# Patient Record
Sex: Female | Born: 1935
Health system: Southern US, Community
[De-identification: ages and names within clinical notes are randomized; demographics above are authoritative.]

## PROBLEM LIST (undated history)

## (undated) DIAGNOSIS — M8580 Other specified disorders of bone density and structure, unspecified site: Secondary | ICD-10-CM

## (undated) DIAGNOSIS — S62109A Fracture of unspecified carpal bone, unspecified wrist, initial encounter for closed fracture: Secondary | ICD-10-CM

## (undated) DIAGNOSIS — R3915 Urgency of urination: Secondary | ICD-10-CM

## (undated) DIAGNOSIS — IMO0002 Reserved for concepts with insufficient information to code with codable children: Secondary | ICD-10-CM

## (undated) DIAGNOSIS — N814 Uterovaginal prolapse, unspecified: Secondary | ICD-10-CM

## (undated) DIAGNOSIS — K5790 Diverticulosis of intestine, part unspecified, without perforation or abscess without bleeding: Secondary | ICD-10-CM

## (undated) DIAGNOSIS — E785 Hyperlipidemia, unspecified: Secondary | ICD-10-CM

## (undated) DIAGNOSIS — M40209 Unspecified kyphosis, site unspecified: Secondary | ICD-10-CM

## (undated) DIAGNOSIS — C73 Malignant neoplasm of thyroid gland: Secondary | ICD-10-CM

## (undated) DIAGNOSIS — I10 Essential (primary) hypertension: Secondary | ICD-10-CM

## (undated) DIAGNOSIS — M519 Unspecified thoracic, thoracolumbar and lumbosacral intervertebral disc disorder: Secondary | ICD-10-CM

## (undated) HISTORY — PX: THYROIDECTOMY, PARTIAL: SHX18

## (undated) HISTORY — DX: Uterovaginal prolapse, unspecified: N81.4

## (undated) HISTORY — DX: Fracture of unspecified carpal bone, unspecified wrist, initial encounter for closed fracture: S62.109A

## (undated) HISTORY — DX: Malignant neoplasm of thyroid gland: C73

## (undated) HISTORY — PX: THYROIDECTOMY: SHX17

## (undated) HISTORY — PX: CERVICAL POLYPECTOMY: SHX88

## (undated) HISTORY — DX: Essential (primary) hypertension: I10

## (undated) HISTORY — DX: Unspecified thoracic, thoracolumbar and lumbosacral intervertebral disc disorder: M51.9

## (undated) HISTORY — DX: Other specified disorders of bone density and structure, unspecified site: M85.80

## (undated) HISTORY — DX: Diverticulosis of intestine, part unspecified, without perforation or abscess without bleeding: K57.90

## (undated) HISTORY — DX: Unspecified kyphosis, site unspecified: M40.209

## (undated) HISTORY — DX: Reserved for concepts with insufficient information to code with codable children: IMO0002

## (undated) HISTORY — DX: Urgency of urination: R39.15

## (undated) HISTORY — DX: Hyperlipidemia, unspecified: E78.5

## (undated) HISTORY — PX: OTHER SURGICAL HISTORY: SHX169

---

## 1968-02-21 DIAGNOSIS — C73 Malignant neoplasm of thyroid gland: Secondary | ICD-10-CM

## 1968-02-21 HISTORY — DX: Malignant neoplasm of thyroid gland: C73

## 1992-09-20 HISTORY — PX: OTHER SURGICAL HISTORY: SHX169

## 1994-09-21 HISTORY — PX: OTHER SURGICAL HISTORY: SHX169

## 1997-01-21 HISTORY — PX: OTHER SURGICAL HISTORY: SHX169

## 1997-04-29 ENCOUNTER — Ambulatory Visit (HOSPITAL_COMMUNITY): Admission: RE | Admit: 1997-04-29 | Discharge: 1997-04-29 | Payer: Self-pay | Admitting: *Deleted

## 1997-07-31 ENCOUNTER — Encounter: Admission: RE | Admit: 1997-07-31 | Discharge: 1997-10-29 | Payer: Self-pay | Admitting: *Deleted

## 1998-05-04 ENCOUNTER — Encounter: Payer: Self-pay | Admitting: *Deleted

## 1998-05-04 ENCOUNTER — Ambulatory Visit (HOSPITAL_COMMUNITY): Admission: RE | Admit: 1998-05-04 | Discharge: 1998-05-04 | Payer: Self-pay | Admitting: *Deleted

## 1998-06-30 ENCOUNTER — Other Ambulatory Visit: Admission: RE | Admit: 1998-06-30 | Discharge: 1998-06-30 | Payer: Self-pay | Admitting: Family Medicine

## 1998-07-06 ENCOUNTER — Ambulatory Visit (HOSPITAL_COMMUNITY): Admission: RE | Admit: 1998-07-06 | Discharge: 1998-07-06 | Payer: Self-pay | Admitting: *Deleted

## 1998-07-06 ENCOUNTER — Encounter: Payer: Self-pay | Admitting: Family Medicine

## 1999-05-10 ENCOUNTER — Encounter: Payer: Self-pay | Admitting: *Deleted

## 1999-05-10 ENCOUNTER — Ambulatory Visit (HOSPITAL_COMMUNITY): Admission: RE | Admit: 1999-05-10 | Discharge: 1999-05-10 | Payer: Self-pay | Admitting: *Deleted

## 1999-10-14 ENCOUNTER — Other Ambulatory Visit: Admission: RE | Admit: 1999-10-14 | Discharge: 1999-10-14 | Payer: Self-pay | Admitting: Family Medicine

## 2000-05-16 ENCOUNTER — Ambulatory Visit (HOSPITAL_COMMUNITY): Admission: RE | Admit: 2000-05-16 | Discharge: 2000-05-16 | Payer: Self-pay | Admitting: *Deleted

## 2000-05-16 ENCOUNTER — Encounter: Payer: Self-pay | Admitting: *Deleted

## 2000-11-08 ENCOUNTER — Other Ambulatory Visit: Admission: RE | Admit: 2000-11-08 | Discharge: 2000-11-08 | Payer: Self-pay | Admitting: Family Medicine

## 2001-05-20 ENCOUNTER — Ambulatory Visit (HOSPITAL_COMMUNITY): Admission: RE | Admit: 2001-05-20 | Discharge: 2001-05-20 | Payer: Self-pay | Admitting: Family Medicine

## 2001-05-20 ENCOUNTER — Encounter: Payer: Self-pay | Admitting: Family Medicine

## 2002-05-27 ENCOUNTER — Encounter: Payer: Self-pay | Admitting: Family Medicine

## 2002-05-27 ENCOUNTER — Ambulatory Visit (HOSPITAL_COMMUNITY): Admission: RE | Admit: 2002-05-27 | Discharge: 2002-05-27 | Payer: Self-pay | Admitting: Family Medicine

## 2002-07-01 ENCOUNTER — Encounter (INDEPENDENT_AMBULATORY_CARE_PROVIDER_SITE_OTHER): Payer: Self-pay | Admitting: Gastroenterology

## 2002-11-24 ENCOUNTER — Other Ambulatory Visit: Admission: RE | Admit: 2002-11-24 | Discharge: 2002-11-24 | Payer: Self-pay | Admitting: Family Medicine

## 2003-06-18 ENCOUNTER — Ambulatory Visit (HOSPITAL_COMMUNITY): Admission: RE | Admit: 2003-06-18 | Discharge: 2003-06-18 | Payer: Self-pay | Admitting: Family Medicine

## 2004-06-23 ENCOUNTER — Ambulatory Visit (HOSPITAL_COMMUNITY): Admission: RE | Admit: 2004-06-23 | Discharge: 2004-06-23 | Payer: Self-pay | Admitting: Family Medicine

## 2004-08-30 ENCOUNTER — Ambulatory Visit (HOSPITAL_COMMUNITY): Admission: RE | Admit: 2004-08-30 | Discharge: 2004-08-30 | Payer: Self-pay | Admitting: Family Medicine

## 2004-09-03 ENCOUNTER — Ambulatory Visit (HOSPITAL_COMMUNITY): Admission: RE | Admit: 2004-09-03 | Discharge: 2004-09-03 | Payer: Self-pay | Admitting: Family Medicine

## 2005-01-03 ENCOUNTER — Other Ambulatory Visit: Admission: RE | Admit: 2005-01-03 | Discharge: 2005-01-03 | Payer: Self-pay | Admitting: Family Medicine

## 2005-01-04 ENCOUNTER — Ambulatory Visit (HOSPITAL_COMMUNITY): Admission: RE | Admit: 2005-01-04 | Discharge: 2005-01-04 | Payer: Self-pay | Admitting: Family Medicine

## 2005-07-10 ENCOUNTER — Ambulatory Visit (HOSPITAL_COMMUNITY): Admission: RE | Admit: 2005-07-10 | Discharge: 2005-07-10 | Payer: Self-pay | Admitting: Family Medicine

## 2006-07-17 ENCOUNTER — Ambulatory Visit (HOSPITAL_COMMUNITY): Admission: RE | Admit: 2006-07-17 | Discharge: 2006-07-17 | Payer: Self-pay | Admitting: Family Medicine

## 2006-07-23 ENCOUNTER — Encounter: Admission: RE | Admit: 2006-07-23 | Discharge: 2006-07-23 | Payer: Self-pay | Admitting: Family Medicine

## 2007-05-13 ENCOUNTER — Emergency Department (HOSPITAL_COMMUNITY): Admission: EM | Admit: 2007-05-13 | Discharge: 2007-05-13 | Payer: Self-pay | Admitting: Emergency Medicine

## 2007-07-30 ENCOUNTER — Ambulatory Visit (HOSPITAL_COMMUNITY): Admission: RE | Admit: 2007-07-30 | Discharge: 2007-07-30 | Payer: Self-pay | Admitting: Family Medicine

## 2007-10-01 ENCOUNTER — Ambulatory Visit: Payer: Self-pay | Admitting: Gastroenterology

## 2008-12-01 ENCOUNTER — Encounter: Admission: RE | Admit: 2008-12-01 | Discharge: 2009-02-18 | Payer: Self-pay | Admitting: Family Medicine

## 2010-03-13 ENCOUNTER — Encounter: Payer: Self-pay | Admitting: Family Medicine

## 2010-06-23 ENCOUNTER — Encounter: Payer: Self-pay | Admitting: Family Medicine

## 2011-03-29 DIAGNOSIS — E785 Hyperlipidemia, unspecified: Secondary | ICD-10-CM | POA: Diagnosis not present

## 2011-03-29 DIAGNOSIS — E559 Vitamin D deficiency, unspecified: Secondary | ICD-10-CM | POA: Diagnosis not present

## 2011-03-29 DIAGNOSIS — J40 Bronchitis, not specified as acute or chronic: Secondary | ICD-10-CM | POA: Diagnosis not present

## 2011-03-29 DIAGNOSIS — R5381 Other malaise: Secondary | ICD-10-CM | POA: Diagnosis not present

## 2011-04-13 DIAGNOSIS — Z1212 Encounter for screening for malignant neoplasm of rectum: Secondary | ICD-10-CM | POA: Diagnosis not present

## 2011-04-18 DIAGNOSIS — Z1231 Encounter for screening mammogram for malignant neoplasm of breast: Secondary | ICD-10-CM | POA: Diagnosis not present

## 2011-06-26 DIAGNOSIS — H01009 Unspecified blepharitis unspecified eye, unspecified eyelid: Secondary | ICD-10-CM | POA: Diagnosis not present

## 2011-06-26 DIAGNOSIS — H409 Unspecified glaucoma: Secondary | ICD-10-CM | POA: Diagnosis not present

## 2011-06-26 DIAGNOSIS — H4011X0 Primary open-angle glaucoma, stage unspecified: Secondary | ICD-10-CM | POA: Diagnosis not present

## 2011-06-26 DIAGNOSIS — H52209 Unspecified astigmatism, unspecified eye: Secondary | ICD-10-CM | POA: Diagnosis not present

## 2011-06-26 DIAGNOSIS — Z961 Presence of intraocular lens: Secondary | ICD-10-CM | POA: Diagnosis not present

## 2011-08-08 DIAGNOSIS — E039 Hypothyroidism, unspecified: Secondary | ICD-10-CM | POA: Diagnosis not present

## 2011-08-08 DIAGNOSIS — E785 Hyperlipidemia, unspecified: Secondary | ICD-10-CM | POA: Diagnosis not present

## 2011-08-08 DIAGNOSIS — R5381 Other malaise: Secondary | ICD-10-CM | POA: Diagnosis not present

## 2011-08-08 DIAGNOSIS — E559 Vitamin D deficiency, unspecified: Secondary | ICD-10-CM | POA: Diagnosis not present

## 2011-08-08 DIAGNOSIS — I10 Essential (primary) hypertension: Secondary | ICD-10-CM | POA: Diagnosis not present

## 2011-08-22 DIAGNOSIS — Z124 Encounter for screening for malignant neoplasm of cervix: Secondary | ICD-10-CM | POA: Diagnosis not present

## 2011-08-22 DIAGNOSIS — I10 Essential (primary) hypertension: Secondary | ICD-10-CM | POA: Diagnosis not present

## 2011-11-21 DIAGNOSIS — E559 Vitamin D deficiency, unspecified: Secondary | ICD-10-CM | POA: Diagnosis not present

## 2011-11-21 DIAGNOSIS — E039 Hypothyroidism, unspecified: Secondary | ICD-10-CM | POA: Diagnosis not present

## 2011-11-21 DIAGNOSIS — M810 Age-related osteoporosis without current pathological fracture: Secondary | ICD-10-CM | POA: Diagnosis not present

## 2011-11-21 DIAGNOSIS — Z23 Encounter for immunization: Secondary | ICD-10-CM | POA: Diagnosis not present

## 2011-11-21 DIAGNOSIS — I10 Essential (primary) hypertension: Secondary | ICD-10-CM | POA: Diagnosis not present

## 2011-11-21 DIAGNOSIS — E785 Hyperlipidemia, unspecified: Secondary | ICD-10-CM | POA: Diagnosis not present

## 2011-12-13 DIAGNOSIS — H4011X0 Primary open-angle glaucoma, stage unspecified: Secondary | ICD-10-CM | POA: Diagnosis not present

## 2012-03-14 DIAGNOSIS — I10 Essential (primary) hypertension: Secondary | ICD-10-CM | POA: Diagnosis not present

## 2012-03-14 DIAGNOSIS — E785 Hyperlipidemia, unspecified: Secondary | ICD-10-CM | POA: Diagnosis not present

## 2012-03-14 DIAGNOSIS — E559 Vitamin D deficiency, unspecified: Secondary | ICD-10-CM | POA: Diagnosis not present

## 2012-04-01 DIAGNOSIS — H409 Unspecified glaucoma: Secondary | ICD-10-CM | POA: Diagnosis not present

## 2012-04-01 DIAGNOSIS — H4011X0 Primary open-angle glaucoma, stage unspecified: Secondary | ICD-10-CM | POA: Diagnosis not present

## 2012-04-02 DIAGNOSIS — E785 Hyperlipidemia, unspecified: Secondary | ICD-10-CM | POA: Diagnosis not present

## 2012-04-02 DIAGNOSIS — I10 Essential (primary) hypertension: Secondary | ICD-10-CM | POA: Diagnosis not present

## 2012-04-09 DIAGNOSIS — Z1212 Encounter for screening for malignant neoplasm of rectum: Secondary | ICD-10-CM | POA: Diagnosis not present

## 2012-05-14 DIAGNOSIS — Z1231 Encounter for screening mammogram for malignant neoplasm of breast: Secondary | ICD-10-CM | POA: Diagnosis not present

## 2012-05-21 ENCOUNTER — Encounter: Payer: Self-pay | Admitting: Family Medicine

## 2012-07-01 DIAGNOSIS — H3530 Unspecified macular degeneration: Secondary | ICD-10-CM | POA: Diagnosis not present

## 2012-07-01 DIAGNOSIS — H4011X0 Primary open-angle glaucoma, stage unspecified: Secondary | ICD-10-CM | POA: Diagnosis not present

## 2012-07-01 DIAGNOSIS — H52209 Unspecified astigmatism, unspecified eye: Secondary | ICD-10-CM | POA: Diagnosis not present

## 2012-07-01 DIAGNOSIS — H409 Unspecified glaucoma: Secondary | ICD-10-CM | POA: Diagnosis not present

## 2012-07-17 ENCOUNTER — Encounter: Payer: Self-pay | Admitting: *Deleted

## 2012-08-04 ENCOUNTER — Other Ambulatory Visit: Payer: Self-pay | Admitting: Family Medicine

## 2012-08-08 ENCOUNTER — Ambulatory Visit: Payer: Self-pay | Admitting: Family Medicine

## 2012-08-19 ENCOUNTER — Other Ambulatory Visit: Payer: Self-pay | Admitting: Family Medicine

## 2012-08-19 ENCOUNTER — Other Ambulatory Visit (INDEPENDENT_AMBULATORY_CARE_PROVIDER_SITE_OTHER): Payer: Medicare Other

## 2012-08-19 DIAGNOSIS — E785 Hyperlipidemia, unspecified: Secondary | ICD-10-CM

## 2012-08-19 DIAGNOSIS — R5381 Other malaise: Secondary | ICD-10-CM | POA: Diagnosis not present

## 2012-08-19 DIAGNOSIS — Z79899 Other long term (current) drug therapy: Secondary | ICD-10-CM

## 2012-08-19 DIAGNOSIS — E559 Vitamin D deficiency, unspecified: Secondary | ICD-10-CM

## 2012-08-19 DIAGNOSIS — R5383 Other fatigue: Secondary | ICD-10-CM

## 2012-08-19 LAB — BASIC METABOLIC PANEL WITH GFR
BUN: 12 mg/dL (ref 6–23)
CO2: 28 mEq/L (ref 19–32)
Calcium: 8.9 mg/dL (ref 8.4–10.5)
Chloride: 110 mEq/L (ref 96–112)
Creat: 0.75 mg/dL (ref 0.50–1.10)
GFR, Est African American: 81 mL/min
GFR, Est Non African American: 70 mL/min
Glucose, Bld: 82 mg/dL (ref 70–99)
Potassium: 4.7 mEq/L (ref 3.5–5.3)
Sodium: 144 mEq/L (ref 135–145)

## 2012-08-19 LAB — HEPATIC FUNCTION PANEL
ALT: 10 U/L (ref 0–35)
AST: 17 U/L (ref 0–37)
Albumin: 3.9 g/dL (ref 3.5–5.2)
Alkaline Phosphatase: 24 U/L — ABNORMAL LOW (ref 39–117)
Bilirubin, Direct: 0.1 mg/dL (ref 0.0–0.3)
Indirect Bilirubin: 0.3 mg/dL (ref 0.0–0.9)
Total Bilirubin: 0.4 mg/dL (ref 0.3–1.2)
Total Protein: 5.7 g/dL — ABNORMAL LOW (ref 6.0–8.3)

## 2012-08-19 LAB — POCT CBC
HCT, POC: 35.9 % — AB (ref 37.7–47.9)
Hemoglobin: 12.9 g/dL (ref 12.2–16.2)
MCH, POC: 31.3 pg — AB (ref 27–31.2)
MCHC: 35.9 g/dL — AB (ref 31.8–35.4)
MCV: 87.2 fL (ref 80–97)
MPV: 9.5 fL (ref 0–99.8)
POC Granulocyte: 3.7 (ref 2–6.9)
RDW, POC: 13.5 %
WBC: 5.9 10*3/uL (ref 4.6–10.2)

## 2012-08-21 LAB — NMR LIPOPROFILE WITH LIPIDS
Cholesterol, Total: 112 mg/dL (ref <200)
HDL-C: 60 mg/dL (ref >=40)
Large HDL-P: 13.8 umol/L (ref >=4.8)
Large VLDL-P: 1.3 nmol/L (ref <=2.7)
Triglycerides: 71 mg/dL (ref <150)
VLDL Size: 53.2 nm — ABNORMAL HIGH (ref <=46.6)

## 2012-08-22 ENCOUNTER — Encounter: Payer: Self-pay | Admitting: Family Medicine

## 2012-08-22 ENCOUNTER — Ambulatory Visit (INDEPENDENT_AMBULATORY_CARE_PROVIDER_SITE_OTHER): Payer: Medicare Other | Admitting: Family Medicine

## 2012-08-22 ENCOUNTER — Ambulatory Visit (INDEPENDENT_AMBULATORY_CARE_PROVIDER_SITE_OTHER): Payer: Medicare Other

## 2012-08-22 VITALS — BP 127/60 | HR 56 | Temp 97.0°F | Ht 61.5 in | Wt 120.0 lb

## 2012-08-22 DIAGNOSIS — E785 Hyperlipidemia, unspecified: Secondary | ICD-10-CM

## 2012-08-22 DIAGNOSIS — Z85850 Personal history of malignant neoplasm of thyroid: Secondary | ICD-10-CM | POA: Diagnosis not present

## 2012-08-22 DIAGNOSIS — I10 Essential (primary) hypertension: Secondary | ICD-10-CM | POA: Insufficient documentation

## 2012-08-22 DIAGNOSIS — G589 Mononeuropathy, unspecified: Secondary | ICD-10-CM

## 2012-08-22 DIAGNOSIS — R413 Other amnesia: Secondary | ICD-10-CM

## 2012-08-22 DIAGNOSIS — M1990 Unspecified osteoarthritis, unspecified site: Secondary | ICD-10-CM

## 2012-08-22 DIAGNOSIS — C73 Malignant neoplasm of thyroid gland: Secondary | ICD-10-CM | POA: Insufficient documentation

## 2012-08-22 DIAGNOSIS — G629 Polyneuropathy, unspecified: Secondary | ICD-10-CM

## 2012-08-22 DIAGNOSIS — E0590 Thyrotoxicosis, unspecified without thyrotoxic crisis or storm: Secondary | ICD-10-CM

## 2012-08-22 DIAGNOSIS — M810 Age-related osteoporosis without current pathological fracture: Secondary | ICD-10-CM

## 2012-08-22 LAB — VITAMIN B12: Vitamin B-12: 215 pg/mL (ref 211–911)

## 2012-08-22 LAB — TSH: TSH: 0.008 u[IU]/mL — ABNORMAL LOW (ref 0.350–4.500)

## 2012-08-22 NOTE — Progress Notes (Addendum)
  Subjective:    Patient ID: Bianca Thompson, female    DOB: [birth year], 79 y.o.   MRN: [id number]  HPI Patient returns to clinic today for followup of chronic medical problems which include hypertension hyperlipidemia remote history of thyroid cancer with hyperthyroidism secondary to medication intake for treatment of thyroid cancer. Her health maintenance is up-to-date. Also see the review of systems. She complains of numbness in both feet and swelling. Her legs just don't feel right especially in the morning when she gets up. She does not have any back pain. The numbness is worse with walking and is when she gets up in the morning. Memory is stable.  Review of Systems  Constitutional: Negative for activity change (DUE TO FEET AND LEG PAIN).  HENT: Positive for postnasal drip.   Eyes: Negative.   Respiratory: Negative.   Cardiovascular: Positive for leg swelling (SWELLING IN FEET).  Gastrointestinal: Negative.   Endocrine: Negative.   Genitourinary: Negative.   Musculoskeletal: Negative.   Skin: Negative.   Allergic/Immunologic: Negative.   Neurological: Negative.   Hematological: Negative.   Psychiatric/Behavioral: Negative.        Objective:   Physical Exam BP 127/60  Pulse 56  Temp(Src) 97 F (36.1 C) (Oral)  Ht 5' 1.5" (1.562 m)  Wt 120 lb (54.432 kg)  BMI 22.31 kg/m2  The patient appeared well nourished and normally developed, alert and oriented to time and place. She is kyphotic and beginning to look somewhat frail.  Speech, behavior and judgement appear normal. Vital signs as documented.  Head exam is unremarkable. No scleral icterus or pallor noted. Ears nose and throat were normal. Neck is without jugular venous distension, thyromegally, or carotid bruits. Carotid upstrokes are brisk bilaterally. No cervical adenopathy. Lungs are clear anteriorly and posteriorly to auscultation. Normal respiratory effort. Cardiac exam reveals regular rate and rhythm at 72 per minute.  First and second heart sounds normal.  No murmurs, rubs or gallops.  Abdominal exam reveals normal bowl sounds, no masses, no organomegaly and no aortic enlargement. No inguinal adenopathy. There is no abdominal tenderness Extremities are nonedematous and both femoral and pedal pulses are normal. There is a lot of arthritic changes in her feet bilaterally with a loss of arches bilaterally. Skin without pallor or jaundice.  Warm and dry, without rash. Neurologic exam reveals normal deep tendon reflexes and normal sensation.  [facility] reading (PRIMARY) by  Dr. Christell Constant: LS-spine; degenerative changes osteoporosis                                        Assessment & Plan:  1. Neuropathy - DG Lumbar Spine 2-3 Views -B12 level  2. History of thyroid cancer -Thyroid  3. Hyperlipidemia Continue current medication  4. Hypertension Continue current meds  5. Hyperthyroidism -Thyroid profile  6. Osteoporosis  7. Memory deficit -Continue current medication  8. Degenerative arthritis -Tylenol as needed for pain  Patient Instructions  Please be careful and do not put herself at risk of falling Drink plenty of fluids We will be back in touch once labs and x-rays are returned

## 2012-08-22 NOTE — Patient Instructions (Signed)
Please be careful and do not put herself at risk of falling Drink plenty of fluids We will be back in touch once labs and x-rays are returned

## 2012-08-28 ENCOUNTER — Other Ambulatory Visit: Payer: Self-pay | Admitting: *Deleted

## 2012-08-28 DIAGNOSIS — E538 Deficiency of other specified B group vitamins: Secondary | ICD-10-CM

## 2012-08-28 MED ORDER — CYANOCOBALAMIN 1000 MCG/ML IJ SOLN
1000.0000 ug | INTRAMUSCULAR | Status: AC
  Administered 2012-08-29 – 2012-09-19 (×4): 1000 ug via INTRAMUSCULAR

## 2012-08-28 MED ORDER — CYANOCOBALAMIN 1000 MCG/ML IJ SOLN
1000.0000 ug | INTRAMUSCULAR | Status: AC
  Administered 2012-10-24 – 2012-11-27 (×2): 1000 ug via INTRAMUSCULAR

## 2012-08-28 NOTE — Progress Notes (Signed)
Patient aware of lab results and orders.

## 2012-08-29 ENCOUNTER — Ambulatory Visit (INDEPENDENT_AMBULATORY_CARE_PROVIDER_SITE_OTHER): Payer: Medicare Other

## 2012-08-29 DIAGNOSIS — E538 Deficiency of other specified B group vitamins: Secondary | ICD-10-CM

## 2012-09-05 ENCOUNTER — Ambulatory Visit (INDEPENDENT_AMBULATORY_CARE_PROVIDER_SITE_OTHER): Payer: Medicare Other | Admitting: Family Medicine

## 2012-09-05 DIAGNOSIS — E538 Deficiency of other specified B group vitamins: Secondary | ICD-10-CM | POA: Diagnosis not present

## 2012-09-12 ENCOUNTER — Ambulatory Visit (INDEPENDENT_AMBULATORY_CARE_PROVIDER_SITE_OTHER): Payer: Medicare Other

## 2012-09-12 DIAGNOSIS — E538 Deficiency of other specified B group vitamins: Secondary | ICD-10-CM | POA: Diagnosis not present

## 2012-09-19 ENCOUNTER — Ambulatory Visit (INDEPENDENT_AMBULATORY_CARE_PROVIDER_SITE_OTHER): Payer: Medicare Other | Admitting: Family Medicine

## 2012-09-19 DIAGNOSIS — E538 Deficiency of other specified B group vitamins: Secondary | ICD-10-CM | POA: Diagnosis not present

## 2012-09-19 NOTE — Patient Instructions (Signed)
Vitamin B12 Injections Every person needs vitamin B12. A deficiency develops when the body does not get enough of it. One way to overcome this is by getting B12 shots (injections). A B12 shot puts the vitamin directly into muscle tissue. This avoids any problems your body might have in absorbing it from food or a pill. In some people, the body has trouble using the vitamin correctly. This can cause a B12 deficiency. Not consuming enough of the vitamin can also cause a deficiency. Getting enough vitamin B12 can be hard for elderly people. Sometimes, they do not eat a well-balanced diet. The elderly are also more likely than younger people to have medical conditions or take medications that can lead to a deficiency. WHAT DOES VITAMIN B12 DO? Vitamin B12 does many things to help the body work right:  It helps the body make healthy red blood cells.  It helps maintain nerve cells.  It is involved in the body's process of converting food into energy (metabolism).  It is needed to make the genetic material in all cells (DNA). VITAMIN B12 FOOD SOURCES Most people get plenty of vitamin B12 through the foods they eat. It is present in:  Meat, fish, poultry, and eggs.  Milk and milk products.  It also is added when certain foods are made, including some breads, cereals and yogurts. The food is then called "fortified". CAUSES The most common causes of vitamin B12 deficiency are:  Pernicious anemia. The condition develops when the body cannot make enough healthy red blood cells. This stems from a lack of a protein made in the stomach (intrinsic factor). People without this protein cannot absorb enough vitamin B12 from food.  Malabsorption. This is when the body cannot absorb the vitamin. It can be caused by:  Pernicious anemia.  Surgery to remove part or all of the stomach can lead to malabsorption. Removal of part or all of the small intestine can also cause malabsorption.  Vegetarian diet.  People who are strict about not eating foods from animals could have trouble taking in enough vitamin B12 from diet alone.  Medications. Some medicines have been linked to B12 deficiency, such as Metformin (a drug prescribed for type 2 diabetes). Long-term use of stomach acid suppressants also can keep the vitamin from being absorbed.  Intestinal problems such as inflammatory bowel disease. If there are problems in the digestive tract, vitamin B12 may not be absorbed in good enough amounts. SYMPTOMS People who do not get enough B12 can develop problems. These can include:  Anemia. This is when the body has too few red blood cells. Red blood cells carry oxygen to the rest of the body. Without a healthy supply of red blood cells, people can feel:  Tired (fatigued).  Weak.  Severe anemia can cause:  Shortness of breath.  Dizziness.  Rapid heart rate.  Paleness.  Other Vitamin B12 deficiency symptoms include:  Diarrhea.  Numbness or tingling in the hands or feet.  Loss of appetite.  Confusion.  Sores on the tongue or in the mouth. LET YOUR CAREGIVER KNOW ABOUT:  Any allergies. It is very important to know if you are allergic or sensitive to cobalt. Vitamin B12 contains cobalt.  Any history of kidney disease.  All medications you are taking. Include prescription and over-the-counter medicines, herbs and creams.  Whether you are pregnant or breast-feeding.  If you have Leber's disease, a hereditary eye condition, vitamin B12 could make it worse. RISKS AND COMPLICATIONS Reactions to an injection are   usually temporary. They might include:  Pain at the injection site.  Redness, swelling or tenderness at the site.  Headache, dizziness or weakness.  Nausea, upset stomach or diarrhea.  Numbness or tingling.  Fever.  Joint pain.  Itching or rash. If a reaction does not go away in a short while, talk with your healthcare provider. A change in the way the shots are  given, or where they are given, might need to be made. BEFORE AN INJECTION To decide whether B12 injections are right for you, your healthcare provider will probably:  Ask about your medical history.  Ask questions about your diet.  Ask about symptoms such as:  Have you felt weak?  Do you feel unusually tired?  Do you get dizzy?  Order blood tests. These may include a test to:  Check the level of red cells in your blood.  Measure B12 levels.  Check for the presence of intrinsic factor. VITAMIN B12 INJECTIONS How often you will need a vitamin B12 injection will depend on how severe your deficiency is. This also will affect how long you will need to get them. People with pernicious anemia usually get injections for their entire life. Others might get them for a shorter period. For many people, injections are given daily or weekly for several weeks. Then, once B12 levels are normal, injections are given just once a month. If the cause of the deficiency can be fixed, the injections can be stopped. Talk with your healthcare provider about what you should expect. For an injection:  The injection site will be cleaned with an alcohol swab.  Your healthcare provider will insert a needle directly into a muscle. Most any muscle can be used. Most often, an arm muscle is used. A buttocks muscle can also be used. Many people say shots in that area are less painful.  A small adhesive bandage may be put over the injection site. It usually can be taken off in an hour or less. Injections can be given by your healthcare provider. In some cases, family members give them. Sometimes, people give them to themselves. Talk with your healthcare provider about what would be best for you. If someone other than your healthcare provider will be giving the shots, the person will need to be trained to give them correctly. HOME CARE INSTRUCTIONS   You can remove the adhesive bandage within an hour of getting a  shot.  You should be able to go about your normal activities right away.  Avoid drinking large amounts of alcohol while taking vitamin B12 shots. Alcohol can interfere with the body's use of the vitamin. SEEK MEDICAL CARE IF:   Pain, redness, swelling or tenderness at the injection site does not get better or gets worse.  Headache, dizziness or weakness does not go away.  You develop a fever of more than 100.5 F (38.1 C). SEEK IMMEDIATE MEDICAL CARE IF:   You have chest pain.  You develop shortness of breath.  You have muscle weakness that gets worse.  You develop numbness, weakness or tingling on one side or one area of the body.  You have symptoms of an allergic reaction, such as:  Hives.  Difficulty breathing.  Swelling of the lips, face, tongue or throat.  You develop a fever of more than 102.0 F (38.9 C). MAKE SURE YOU:   Understand these instructions.  Will watch your condition.  Will get help right away if you are not doing well or get worse. Document   Released: 05/05/2008 Document Revised: 05/01/2011 Document Reviewed: 05/05/2008 [employer] Patient Information 2014 [employer].  

## 2012-10-14 ENCOUNTER — Other Ambulatory Visit: Payer: Self-pay | Admitting: Family Medicine

## 2012-10-14 ENCOUNTER — Ambulatory Visit (INDEPENDENT_AMBULATORY_CARE_PROVIDER_SITE_OTHER): Payer: Medicare Other | Admitting: General Practice

## 2012-10-14 ENCOUNTER — Telehealth: Payer: Self-pay | Admitting: Family Medicine

## 2012-10-14 ENCOUNTER — Ambulatory Visit: Payer: Medicare Other

## 2012-10-14 ENCOUNTER — Encounter: Payer: Self-pay | Admitting: General Practice

## 2012-10-14 VITALS — BP 120/61 | HR 55 | Temp 97.1°F | Ht 60.0 in | Wt 116.5 lb

## 2012-10-14 DIAGNOSIS — L259 Unspecified contact dermatitis, unspecified cause: Secondary | ICD-10-CM

## 2012-10-14 MED ORDER — TRIAMCINOLONE ACETONIDE 0.025 % EX OINT: TOPICAL_OINTMENT | Freq: Two times a day (BID) | CUTANEOUS | Status: DC

## 2012-10-14 MED ORDER — METHYLPREDNISOLONE ACETATE 40 MG/ML IJ SUSP
40.0000 mg | Freq: Once | INTRAMUSCULAR | Status: AC
  Administered 2012-10-14: 40 mg via INTRAMUSCULAR

## 2012-10-14 NOTE — Patient Instructions (Addendum)

## 2012-10-14 NOTE — Telephone Encounter (Signed)
appt made

## 2012-10-14 NOTE — Progress Notes (Signed)
  Subjective:    Patient ID: Bianca Thompson, female    DOB: [birth year], 79 y.o.   MRN: [id number]  Rash This is a new problem. The current episode started 1 to 4 weeks ago. The problem has been gradually worsening since onset. The affected locations include the left upper leg, back, left ankle, right upper leg and left lower leg. The rash is characterized by redness and itchiness. She was exposed to nothing. Pertinent negatives include no congestion, cough, fever, rhinorrhea, shortness of breath or sore throat. Past treatments include anti-itch cream. There is no history of allergies or asthma.      Review of Systems  Constitutional: Negative for fever and chills.  HENT: Negative for congestion, sore throat, rhinorrhea, sneezing, neck pain and neck stiffness.   Respiratory: Negative for cough, chest tightness and shortness of breath.   Gastrointestinal: Negative for abdominal pain.  Genitourinary: Negative for dysuria and difficulty urinating.  Skin: Positive for rash.       Red rash to legs, and back  Neurological: Negative for dizziness, weakness and headaches.       Objective:   Physical Exam  Constitutional: She is oriented to person, place, and time. She appears well-developed and well-nourished.  Cardiovascular: Normal rate, regular rhythm and normal heart sounds.   Pulmonary/Chest: Effort normal and breath sounds normal.  Neurological: She is alert and oriented to person, place, and time.  Skin: Skin is warm and dry. Rash noted. There is erythema.  Maculopapular, erythematous rash to bilateral lower legs, left worse than right. Rash also noted to upper back. Negative for drainage.   Psychiatric: She has a normal mood and affect.          Assessment & Plan:  1. Contact dermatitis - methylPREDNISolone acetate (DEPO-MEDROL) injection 40 mg; Inject 1 mL (40 mg total) into the muscle once. - triamcinolone (KENALOG) 0.025 % ointment; Apply topically 2 (two) times daily. Do not  apply to face  Dispense: 30 g; Refill: 0 -refrain from scratching -avoid irritants -RTO if symptoms worsen or unresolved Patient verbalized understanding Coralie Keens, FNP-C

## 2012-10-22 ENCOUNTER — Ambulatory Visit: Payer: Medicare Other

## 2012-10-24 ENCOUNTER — Ambulatory Visit (INDEPENDENT_AMBULATORY_CARE_PROVIDER_SITE_OTHER): Payer: Medicare Other

## 2012-10-24 DIAGNOSIS — E538 Deficiency of other specified B group vitamins: Secondary | ICD-10-CM | POA: Diagnosis not present

## 2012-11-25 ENCOUNTER — Ambulatory Visit: Payer: Medicare Other

## 2012-11-26 ENCOUNTER — Other Ambulatory Visit: Payer: Self-pay | Admitting: Family Medicine

## 2012-11-27 ENCOUNTER — Ambulatory Visit (INDEPENDENT_AMBULATORY_CARE_PROVIDER_SITE_OTHER): Payer: Medicare Other

## 2012-11-27 ENCOUNTER — Encounter (INDEPENDENT_AMBULATORY_CARE_PROVIDER_SITE_OTHER): Payer: Self-pay

## 2012-11-27 ENCOUNTER — Ambulatory Visit: Payer: Medicare Other | Admitting: *Deleted

## 2012-11-27 DIAGNOSIS — E538 Deficiency of other specified B group vitamins: Secondary | ICD-10-CM

## 2012-11-27 DIAGNOSIS — Z23 Encounter for immunization: Secondary | ICD-10-CM

## 2012-12-08 ENCOUNTER — Other Ambulatory Visit: Payer: Self-pay | Admitting: Family Medicine

## 2012-12-30 ENCOUNTER — Ambulatory Visit (INDEPENDENT_AMBULATORY_CARE_PROVIDER_SITE_OTHER): Payer: Medicare Other

## 2012-12-30 DIAGNOSIS — E538 Deficiency of other specified B group vitamins: Secondary | ICD-10-CM

## 2012-12-30 MED ORDER — CYANOCOBALAMIN 1000 MCG/ML IJ SOLN: 1000.0000 ug | Freq: Once | INTRAMUSCULAR | Status: DC

## 2012-12-30 MED ORDER — CYANOCOBALAMIN 1000 MCG/ML IJ SOLN
1000.0000 ug | INTRAMUSCULAR | Status: AC
  Administered 2012-12-30 – 2014-02-23 (×5): 1000 ug via INTRAMUSCULAR

## 2013-01-13 DIAGNOSIS — H409 Unspecified glaucoma: Secondary | ICD-10-CM | POA: Diagnosis not present

## 2013-01-13 DIAGNOSIS — H4011X0 Primary open-angle glaucoma, stage unspecified: Secondary | ICD-10-CM | POA: Diagnosis not present

## 2013-01-20 ENCOUNTER — Telehealth: Payer: Self-pay | Admitting: Family Medicine

## 2013-01-20 MED ORDER — RALOXIFENE HCL 60 MG PO TABS: 60.0000 mg | ORAL_TABLET | Freq: Every day | ORAL | Status: DC

## 2013-01-20 NOTE — Telephone Encounter (Signed)
Pt notified no samples available RX for Evista called into [facility]

## 2013-01-20 NOTE — Addendum Note (Signed)
Addended by: Bearl Mulberry on: 01/20/2013 05:29 PM   Modules accepted: Orders

## 2013-01-25 ENCOUNTER — Other Ambulatory Visit: Payer: Self-pay | Admitting: Family Medicine

## 2013-01-27 NOTE — Telephone Encounter (Signed)
Last seen 10/14/12  Mae

## 2013-01-30 ENCOUNTER — Ambulatory Visit: Payer: Medicare Other

## 2013-02-03 ENCOUNTER — Ambulatory Visit (INDEPENDENT_AMBULATORY_CARE_PROVIDER_SITE_OTHER): Payer: Medicare Other | Admitting: Family Medicine

## 2013-02-03 ENCOUNTER — Encounter: Payer: Self-pay | Admitting: Family Medicine

## 2013-02-03 VITALS — BP 138/74 | HR 57 | Temp 97.0°F | Ht 60.0 in | Wt 118.0 lb

## 2013-02-03 DIAGNOSIS — R5381 Other malaise: Secondary | ICD-10-CM | POA: Diagnosis not present

## 2013-02-03 DIAGNOSIS — R413 Other amnesia: Secondary | ICD-10-CM

## 2013-02-03 DIAGNOSIS — E039 Hypothyroidism, unspecified: Secondary | ICD-10-CM | POA: Diagnosis not present

## 2013-02-03 DIAGNOSIS — E538 Deficiency of other specified B group vitamins: Secondary | ICD-10-CM | POA: Diagnosis not present

## 2013-02-03 DIAGNOSIS — I10 Essential (primary) hypertension: Secondary | ICD-10-CM

## 2013-02-03 DIAGNOSIS — E559 Vitamin D deficiency, unspecified: Secondary | ICD-10-CM | POA: Diagnosis not present

## 2013-02-03 DIAGNOSIS — M810 Age-related osteoporosis without current pathological fracture: Secondary | ICD-10-CM

## 2013-02-03 DIAGNOSIS — E785 Hyperlipidemia, unspecified: Secondary | ICD-10-CM

## 2013-02-03 DIAGNOSIS — E0590 Thyrotoxicosis, unspecified without thyrotoxic crisis or storm: Secondary | ICD-10-CM

## 2013-02-03 LAB — POCT CBC
Granulocyte percent: 64.7 %G (ref 37–80)
Lymph, poc: 1.7 (ref 0.6–3.4)
MCH, POC: 29.6 pg (ref 27–31.2)
MCV: 90.4 fL (ref 80–97)
POC LYMPH PERCENT: 26.8 %L (ref 10–50)
Platelet Count, POC: 176 10*3/uL (ref 142–424)
RDW, POC: 13.4 %
WBC: 6.2 10*3/uL (ref 4.6–10.2)

## 2013-02-03 MED ORDER — MEMANTINE HCL 28 X 5 MG & 21 X 10 MG PO TABS: ORAL_TABLET | ORAL | Status: DC

## 2013-02-03 MED ORDER — AMLODIPINE BESYLATE 5 MG PO TABS: ORAL_TABLET | ORAL | Status: DC

## 2013-02-03 MED ORDER — QUINAPRIL HCL 40 MG PO TABS: ORAL_TABLET | ORAL | Status: DC

## 2013-02-03 MED ORDER — RALOXIFENE HCL 60 MG PO TABS: 60.0000 mg | ORAL_TABLET | Freq: Every day | ORAL | Status: DC

## 2013-02-03 MED ORDER — DONEPEZIL HCL 10 MG PO TABS: ORAL_TABLET | ORAL | Status: DC

## 2013-02-03 MED ORDER — EZETIMIBE-SIMVASTATIN 10-20 MG PO TABS: 1.0000 | ORAL_TABLET | Freq: Every day | ORAL | Status: DC

## 2013-02-03 MED ORDER — SYNTHROID 112 MCG PO TABS: ORAL_TABLET | ORAL | Status: DC

## 2013-02-03 NOTE — Progress Notes (Signed)
Subjective:    Patient ID: Bianca Thompson, female    DOB: February 08, 1921, 79 y.o.   MRN: [id number]  HPI Pt here for follow up and management of chronic medical problems. According to her husband who was seen in a separate room today, the patient continues to have trouble with her memory. She indicates that this continues to be a problem and she wants to do all that she can do to help with this.      Patient Active Problem List   Diagnosis Date Noted  . History of thyroid cancer 08/22/2012  . Hyperlipidemia 08/22/2012  . Hypertension 08/22/2012  . Hyperthyroidism 08/22/2012  . Osteoporosis 08/22/2012  . Memory deficit 08/22/2012  . Degenerative arthritis of the lumbar spine 08/22/2012   Outpatient Encounter Prescriptions as of 02/03/2013  Medication Sig  . amLODipine (NORVASC) 5 MG tablet TAKE ONE HALF TABLET BY MOUTH EVERY DAY  . Ascorbic Acid (VITAMIN C) 500 MG tablet Take 500 mg by mouth daily.    Marland Kitchen aspirin (SB LOW DOSE ASA EC) 81 MG EC tablet Take 81 mg by mouth daily.    . calcium-vitamin D (OSCAL WITH D) 500-200 MG-UNIT per tablet Take 1 tablet by mouth daily.    . Cholecalciferol (VITAMIN D) 2000 UNITS CAPS Take by mouth. Take 2 tablets by mouth Saturday and Sunday . Then on Monday -Friday take one tablet by mouth   . donepezil (ARICEPT) 10 MG tablet TAKE ONE TABLET BY MOUTH ONE TIME DAILY  . dorzolamide-timolol (COSOPT) 22.3-6.8 MG/ML ophthalmic solution   . ezetimibe-simvastatin (VYTORIN) 10-20 MG per tablet Take 1 tablet by mouth at bedtime.    . Garlic 1000 MG CAPS Take 1,000 capsules by mouth daily.  Marland Kitchen latanoprost (XALATAN) 0.005 % ophthalmic solution   . Omega-3 Fatty Acids (FISH OIL) 1200 MG CAPS Take by mouth. Take 2-3 tablets by mouth daily   . quinapril (ACCUPRIL) 40 MG tablet TAKE ONE TABLET BY MOUTH ONE TIME DAILY  . raloxifene (EVISTA) 60 MG tablet Take 1 tablet (60 mg total) by mouth daily.  Marland Kitchen SYNTHROID 112 MCG tablet TAKE ONE TABLET BY MOUTH ONE TIME DAILY  .  [DISCONTINUED] triamcinolone (KENALOG) 0.025 % ointment Apply topically 2 (two) times daily. Do not apply to face    Review of Systems  Constitutional: Negative.   HENT: Negative.   Eyes: Negative.   Respiratory: Negative.   Cardiovascular: Negative.   Gastrointestinal: Negative.   Endocrine: Negative.   Genitourinary: Negative.   Musculoskeletal: Negative.   Skin: Negative.   Allergic/Immunologic: Negative.   Neurological: Negative.   Hematological: Negative.   Psychiatric/Behavioral: Negative.        Objective:   Physical Exam  Nursing note and vitals reviewed. Constitutional: She is oriented to person, place, and time. She appears well-developed and well-nourished. No distress.  Pleasant but elderly female concerned about her memory  HENT:  Head: Normocephalic and atraumatic.  Right Ear: External ear normal.  Left Ear: External ear normal.  Nose: Nose normal.  Mouth/Throat: Oropharynx is clear and moist. No oropharyngeal exudate.  Eyes: Conjunctivae and EOM are normal. Pupils are equal, round, and reactive to light. Right eye exhibits no discharge. Left eye exhibits no discharge. No scleral icterus.  Neck: Normal range of motion. Neck supple. No JVD present. No thyromegaly present.  No thyroid abnormality or adenopathy. No bruits  Cardiovascular: Normal rate, regular rhythm, normal heart sounds and intact distal pulses.  Exam reveals no gallop and no friction rub.  No murmur heard. At 72 per minute  Pulmonary/Chest: Effort normal and breath sounds normal. No respiratory distress. She has no wheezes. She has no rales. She exhibits no tenderness.  Abdominal: Soft. Bowel sounds are normal. She exhibits no mass. There is no tenderness. There is no rebound and no guarding.  Musculoskeletal: Normal range of motion. She exhibits no edema and no tenderness.  Pedal deformities especially in the MP joints with arthritic deformities.hesitant range of motion due to generalized joint  stiffness  in lower extremity  Lymphadenopathy:    She has no cervical adenopathy.  Neurological: She is alert and oriented to person, place, and time. She has normal reflexes. No cranial nerve deficit.  Decreased memory especially with names  Skin: Skin is warm and dry.  Psychiatric: She has a normal mood and affect. Her behavior is normal. Judgment and thought content normal.   BP 138/74  Pulse 57  Temp(Src) 97 F (36.1 C) (Oral)  Ht 5' (1.524 m)  Wt 118 lb (53.524 kg)  BMI 23.05 kg/m2        Assessment & Plan:  1. B12 deficiency - Vitamin B12  2. Other malaise and fatigue - POCT CBC  3. Essential hypertension, benign - BMP8+EGFR  4. Other and unspecified hyperlipidemia - Hepatic function panel - NMR, lipoprofile  5. Unspecified vitamin D deficiency - Vit D  25 hydroxy (rtn osteoporosis monitoring)  6. Unspecified hypothyroidism - TSH  7. Hyperlipidemia  8. Hypertension  9. Hyperthyroidism  10. Osteoporosis  11.remote history of thyroid cancer  Patient Instructions  Continue current medications. Continue good therapeutic lifestyle changes which include good diet and exercise. Fall precautions discussed with patient. Schedule your flu vaccine if you haven't had it yet If you are over 59 years old - you may need Prevnar 13 or the adult Pneumonia vaccine. Take medication as directed per started up pack Try to exercise extremities as much as possible Use a cool mist humidifier in her bedroom at night time and keep the house cooler, where more clothing to stay warm   Nyra Capes MD

## 2013-02-03 NOTE — Patient Instructions (Addendum)
Continue current medications. Continue good therapeutic lifestyle changes which include good diet and exercise. Fall precautions discussed with patient. Schedule your flu vaccine if you haven't had it yet If you are over 77 years old - you may need Prevnar 13 or the adult Pneumonia vaccine. Take medication as directed per started up pack Try to exercise extremities as much as possible Use a cool mist humidifier in her bedroom at night time and keep the house cooler, where more clothing to stay warm

## 2013-02-05 LAB — TSH: TSH: 0.008 u[IU]/mL — ABNORMAL LOW (ref 0.450–4.500)

## 2013-02-05 LAB — VITAMIN D 25 HYDROXY (VIT D DEFICIENCY, FRACTURES): Vit D, 25-Hydroxy: 47.2 ng/mL (ref 30.0–100.0)

## 2013-02-05 LAB — BMP8+EGFR
BUN/Creatinine Ratio: 20 (ref 11–26)
CO2: 24 mmol/L (ref 18–29)
Chloride: 110 mmol/L — ABNORMAL HIGH (ref 97–108)
Potassium: 4.3 mmol/L (ref 3.5–5.2)
Sodium: 148 mmol/L — ABNORMAL HIGH (ref 134–144)

## 2013-02-05 LAB — NMR, LIPOPROFILE
HDL Cholesterol by NMR: 74 mg/dL (ref >=40)
LDL Particle Number: 321 nmol/L (ref <1000)
LDLC SERPL CALC-MCNC: 42 mg/dL (ref <100)
Small LDL Particle Number: <90 nmol/L (ref <=527)

## 2013-02-05 LAB — HEPATIC FUNCTION PANEL
ALT: 8 IU/L (ref 0–32)
AST: 16 IU/L (ref 0–40)
Total Protein: 6.2 g/dL (ref 6.0–8.5)

## 2013-02-05 LAB — VITAMIN B12: Vitamin B-12: 597 pg/mL (ref 211–946)

## 2013-02-21 ENCOUNTER — Telehealth: Payer: Self-pay

## 2013-02-21 NOTE — Telephone Encounter (Signed)
I thought that we had already call this prescription in for re\re be. The Namenda should be taken 5 mg for the first week. 5 mg twice daily for the second week. 10 mg in the morning and 5 mg in the evening for the third week. On the fourth week she should be on 10 mg twice daily. I am certain that this was called in and explained to her when she made her last visit. She is having some memory issues. Please confirm with the pharmacist at [facility] that she did not pick up this prescription. I specifically remember that it was called in. If it was not worse she did not take it, please make sure that she takes it as directed. I believe they have a starter pack for the first month. Once this is clarified he can let the patient understands how to take the medication and that it will be called in.

## 2013-02-21 NOTE — Telephone Encounter (Signed)
Was going over med list from last visit and it has Namenda on list.  She is not taking this and was not given a RX for it.  Is she suppose to be taking?  If so she needs a Careers information officer for [employer]

## 2013-02-24 ENCOUNTER — Encounter: Payer: Self-pay | Admitting: *Deleted

## 2013-02-24 NOTE — Progress Notes (Signed)
Quick Note:  Copy of labs sent to patient ______ 

## 2013-02-25 NOTE — Telephone Encounter (Signed)
Printed prescription was given to the patient at her last appointment.

## 2013-02-26 ENCOUNTER — Other Ambulatory Visit: Payer: Self-pay | Admitting: Family Medicine

## 2013-02-28 ENCOUNTER — Other Ambulatory Visit: Payer: Self-pay | Admitting: *Deleted

## 2013-02-28 NOTE — Telephone Encounter (Signed)
P 

## 2013-03-14 ENCOUNTER — Telehealth: Payer: Self-pay | Admitting: *Deleted

## 2013-03-14 NOTE — Telephone Encounter (Signed)
Yes, change to Namenda XR and get prior authorization

## 2013-03-14 NOTE — Telephone Encounter (Signed)
Dr. Gwenette Greet is not on pts formulary however they say namenda xr is (however it will need a prior authorization) is this something you might want to switch to or shall we try to bulldoze the namenda through?

## 2013-04-12 ENCOUNTER — Other Ambulatory Visit: Payer: Self-pay | Admitting: Family Medicine

## 2013-04-17 ENCOUNTER — Other Ambulatory Visit: Payer: Self-pay | Admitting: *Deleted

## 2013-04-17 MED ORDER — QUINAPRIL HCL 40 MG PO TABS: ORAL_TABLET | ORAL | Status: DC

## 2013-04-18 ENCOUNTER — Telehealth: Payer: Self-pay | Admitting: Family Medicine

## 2013-04-18 NOTE — Telephone Encounter (Signed)
Patient wants to know if she should continue with B12 shots and also she needs a prior authorization for vytorin 10-20. Can this med be changed because she is out of meds? Please advise

## 2013-04-19 NOTE — Telephone Encounter (Signed)
The patient should continue with her B12 shots When current prescription of Vytorin is completed discontinue Vytorin and start simvastatin 20 mg one daily. Call this prescription in for patient with refills

## 2013-04-21 ENCOUNTER — Ambulatory Visit (INDEPENDENT_AMBULATORY_CARE_PROVIDER_SITE_OTHER): Payer: Medicare Other | Admitting: Family Medicine

## 2013-04-21 ENCOUNTER — Ambulatory Visit (INDEPENDENT_AMBULATORY_CARE_PROVIDER_SITE_OTHER): Payer: Medicare Other

## 2013-04-21 ENCOUNTER — Encounter: Payer: Self-pay | Admitting: Family Medicine

## 2013-04-21 VITALS — BP 151/66 | HR 81 | Temp 96.4°F | Ht 60.0 in | Wt 119.0 lb

## 2013-04-21 DIAGNOSIS — M25469 Effusion, unspecified knee: Secondary | ICD-10-CM | POA: Diagnosis not present

## 2013-04-21 DIAGNOSIS — S1093XA Contusion of unspecified part of neck, initial encounter: Secondary | ICD-10-CM | POA: Diagnosis not present

## 2013-04-21 DIAGNOSIS — Z9181 History of falling: Secondary | ICD-10-CM

## 2013-04-21 DIAGNOSIS — S0083XA Contusion of other part of head, initial encounter: Secondary | ICD-10-CM

## 2013-04-21 DIAGNOSIS — T07XXXA Unspecified multiple injuries, initial encounter: Secondary | ICD-10-CM | POA: Diagnosis not present

## 2013-04-21 DIAGNOSIS — E538 Deficiency of other specified B group vitamins: Secondary | ICD-10-CM | POA: Diagnosis not present

## 2013-04-21 DIAGNOSIS — S0003XA Contusion of scalp, initial encounter: Secondary | ICD-10-CM

## 2013-04-21 DIAGNOSIS — M25462 Effusion, left knee: Secondary | ICD-10-CM

## 2013-04-21 NOTE — Progress Notes (Signed)
Subjective:    Patient ID: Bianca Thompson, female    DOB: Jun 14, 1920, 80 y.o.   MRN: [id number]  HPI Patient here today for follow up from fall 3 days ago and meds to be reviewed. The patient has never taken any of her Namenda. She indicates that she fell on her deck 3 days ago. She is alert and talkative. She has contusions of her left knee and the right side of her face. She once again indicates that she has no pain and she was not having any trouble walking. She also denies any dizziness or headache.      Patient Active Problem List   Diagnosis Date Noted  . History of thyroid cancer 08/22/2012  . Hyperlipidemia 08/22/2012  . Hypertension 08/22/2012  . Hyperthyroidism 08/22/2012  . Osteoporosis 08/22/2012  . Memory deficit 08/22/2012  . Degenerative arthritis of the lumbar spine 08/22/2012   Outpatient Encounter Prescriptions as of 04/21/2013  Medication Sig  . amLODipine (NORVASC) 5 MG tablet TAKE ONE HALF TABLET BY MOUTH EVERY DAY  . amLODipine (NORVASC) 5 MG tablet TAKE ONE HALF TABLET BY MOUTH EVERY DAY  . Ascorbic Acid (VITAMIN C) 500 MG tablet Take 500 mg by mouth daily.    Marland Kitchen aspirin (SB LOW DOSE ASA EC) 81 MG EC tablet Take 81 mg by mouth daily.    . calcium-vitamin D (OSCAL WITH D) 500-200 MG-UNIT per tablet Take 1 tablet by mouth daily.    . Cholecalciferol (VITAMIN D) 2000 UNITS CAPS Take by mouth. Take 2 tablets by mouth Saturday and Sunday . Then on Monday -Friday take one tablet by mouth   . donepezil (ARICEPT) 10 MG tablet TAKE ONE TABLET BY MOUTH ONE TIME DAILY  . dorzolamide-timolol (COSOPT) 22.3-6.8 MG/ML ophthalmic solution   . ezetimibe-simvastatin (VYTORIN) 10-20 MG per tablet Take 1 tablet by mouth at bedtime.  . Garlic 1000 MG CAPS Take 1,000 capsules by mouth daily.  Marland Kitchen latanoprost (XALATAN) 0.005 % ophthalmic solution   . Omega-3 Fatty Acids (FISH OIL) 1200 MG CAPS Take by mouth. Take 2-3 tablets by mouth daily   . quinapril (ACCUPRIL) 40 MG tablet TAKE ONE  TABLET BY MOUTH ONE TIME DAILY  . raloxifene (EVISTA) 60 MG tablet TAKE ONE TABLET BY MOUTH ONCE DAILY  . SYNTHROID 112 MCG tablet TAKE ONE TABLET BY MOUTH ONE TIME DAILY  . memantine (NAMENDA TITRATION PAK) tablet pack 5 mg/day for =1 week; 5 mg twice daily for =1 week; 15 mg/day given in 5 mg and 10 mg separated doses for =1 week; then 10 mg twice daily    Review of Systems  Constitutional: Negative.   HENT: Negative.   Eyes: Negative.   Respiratory: Negative.   Cardiovascular: Negative.   Gastrointestinal: Negative.   Endocrine: Negative.   Genitourinary: Negative.   Musculoskeletal: Negative.        Multiple bruises- pt denies any pain  Skin: Negative.   Allergic/Immunologic: Negative.   Neurological: Negative.   Hematological: Negative.   Psychiatric/Behavioral: Negative.        Objective:   Physical Exam  Nursing note and vitals reviewed. Constitutional: She is oriented to person, place, and time. She appears well-developed and well-nourished. No distress.  Patient is pleasant and cooperative and alert. She has obvious multiple contusions of her face both arms and left knee. This came from a fall 3 days ago when she was going up on her deck with a bag of groceries in each hand.  HENT:  Right  Ear: External ear normal.  Left Ear: External ear normal.  Nose: Nose normal.  Mouth/Throat: Oropharynx is clear and moist.  Right-sided facial contusion and mandible swelling no erythema or rubor, large contusion right side of face  Eyes: Conjunctivae and EOM are normal. Pupils are equal, round, and reactive to light. Right eye exhibits no discharge. Left eye exhibits no discharge. No scleral icterus.  Neck: Normal range of motion. Neck supple. No JVD present. No thyromegaly present.  Cardiovascular: Normal rate, regular rhythm and normal heart sounds.  Exam reveals no gallop and no friction rub.   No murmur heard. Pulmonary/Chest: Effort normal and breath sounds normal. No  respiratory distress. She has no wheezes. She has no rales. She exhibits no tenderness.  Abdominal: Soft. Bowel sounds are normal. She exhibits no mass. There is no tenderness. There is no rebound and no guarding.  Musculoskeletal: Normal range of motion. She exhibits no edema and no tenderness.  Patient has good mobility of all extremities and joints without pain this includes both hip joints both knees both arms and shoulders. She has contusions on both forearms right greater than left and bruising in the hand of on the right side  Lymphadenopathy:    She has no cervical adenopathy.  Neurological: She is alert and oriented to person, place, and time. No cranial nerve deficit.  Skin: Skin is warm and dry. No rash noted.  Psychiatric: She has a normal mood and affect. Her behavior is normal. Judgment and thought content normal.   BP 151/66  Pulse 81  Temp(Src) 96.4 F (35.8 C) (Oral)  Ht 5' (1.524 m)  Wt 119 lb (53.978 kg)  BMI 23.24 kg/m2  [facility] reading (PRIMARY) by  Dr.Destina Mantei-facial bones and left knee  --- no fractures are noted in either the facial bones or the left knee                                      Assessment & Plan:  1. Other B-complex deficiencies  2. Swelling of left knee joint - DG Knee 1-2 Views Left; Future  3. Facial bruising - DG Facial Bones Complete; Future  4. Status post fall  5. Multiple contusions   Patient Instructions  Head Injury, Adult You have received a head injury. It does not appear serious at this time. Headaches and vomiting are common following head injury. It should be easy to awaken from sleeping. Sometimes it is necessary for you to stay in the emergency department for a while for observation. Sometimes admission to the hospital may be needed. After injuries such as yours, most problems occur within the first 24 hours, but side effects may occur up to 7 10 days after the injury. It is important for you to carefully monitor your condition  and contact your health care provider or seek immediate medical care if there is a change in your condition. WHAT ARE THE TYPES OF HEAD INJURIES? Head injuries can be as minor as a bump. Some head injuries can be more severe. More severe head injuries include:  A jarring injury to the brain (concussion).  A bruise of the brain (contusion). This mean there is bleeding in the brain that can cause swelling.  A cracked skull (skull fracture).  Bleeding in the brain that collects, clots, and forms a bump (hematoma). WHAT CAUSES A HEAD INJURY? A serious head injury is most likely to happen to  someone who is in a car wreck and is not wearing a seat belt. Other causes of major head injuries include bicycle or motorcycle accidents, sports injuries, and falls. HOW ARE HEAD INJURIES DIAGNOSED? A complete history of the event leading to the injury and your current symptoms will be helpful in diagnosing head injuries. Many times, pictures of the brain, such as CT or MRI are needed to see the extent of the injury. Often, an overnight hospital stay is necessary for observation.  WHEN SHOULD I SEEK IMMEDIATE MEDICAL CARE?  You should get help right away if:  You have confusion or drowsiness.  You feel sick to your stomach (nauseous) or have continued, forceful vomiting.  You have dizziness or unsteadiness that is getting worse.  You have severe, continued headaches not relieved by medicine. Only take over-the-counter or prescription medicines for pain, fever, or discomfort as directed by your health care provider.  You do not have normal function of the arms or legs or are unable to walk.  You notice changes in the black spots in the center of the colored part of your eye (pupil).  You have a clear or bloody fluid coming from your nose or ears.  You have a loss of vision. During the next 24 hours after the injury, you must stay with someone who can watch you for the warning signs. This person should  contact local emergency services (911 in the [place] if you have seizures, you become unconscious, or you are unable to wake up. HOW CAN I PREVENT A HEAD INJURY IN THE FUTURE? The most important factor for preventing major head injuries is avoiding motor vehicle accidents. To minimize the potential for damage to your head, it is crucial to wear seat belts while riding in motor vehicles. Wearing helmets while bike riding and playing collision sports (like football) is also helpful. Also, avoiding dangerous activities around the house will further help reduce your risk of head injury.  WHEN CAN I RETURN TO NORMAL ACTIVITIES AND ATHLETICS? You should be reevaluated by your health care provider before returning to these activities. If you have any of the following symptoms, you should not return to activities or contact sports until 1 week after the symptoms have stopped:  Persistent headache.  Dizziness or vertigo.  Poor attention and concentration.  Confusion.  Memory problems.  Nausea or vomiting.  Fatigue or tire easily.  Irritability.  Intolerant of bright lights or loud noises.  Anxiety or depression.  Disturbed sleep. MAKE SURE YOU:   Understand these instructions.  Will watch your condition.  Will get help right away if you are not doing well or get worse. Document Released: 02/06/2005 Document Revised: 11/27/2012 Document Reviewed: 10/14/2012 [facility] Patient Information 2014 [place], [place].   Take Tylenol as needed for pain Try to be more careful with climbing and carrying groceries in each arm   Nyra Capes MD

## 2013-04-21 NOTE — Patient Instructions (Addendum)
Head Injury, Adult You have received a head injury. It does not appear serious at this time. Headaches and vomiting are common following head injury. It should be easy to awaken from sleeping. Sometimes it is necessary for you to stay in the emergency department for a while for observation. Sometimes admission to the hospital may be needed. After injuries such as yours, most problems occur within the first 24 hours, but side effects may occur up to 7 10 days after the injury. It is important for you to carefully monitor your condition and contact your health care provider or seek immediate medical care if there is a change in your condition. WHAT ARE THE TYPES OF HEAD INJURIES? Head injuries can be as minor as a bump. Some head injuries can be more severe. More severe head injuries include:  A jarring injury to the brain (concussion).  A bruise of the brain (contusion). This mean there is bleeding in the brain that can cause swelling.  A cracked skull (skull fracture).  Bleeding in the brain that collects, clots, and forms a bump (hematoma). WHAT CAUSES A HEAD INJURY? A serious head injury is most likely to happen to someone who is in a car wreck and is not wearing a seat belt. Other causes of major head injuries include bicycle or motorcycle accidents, sports injuries, and falls. HOW ARE HEAD INJURIES DIAGNOSED? A complete history of the event leading to the injury and your current symptoms will be helpful in diagnosing head injuries. Many times, pictures of the brain, such as CT or MRI are needed to see the extent of the injury. Often, an overnight hospital stay is necessary for observation.  WHEN SHOULD I SEEK IMMEDIATE MEDICAL CARE?  You should get help right away if:  You have confusion or drowsiness.  You feel sick to your stomach (nauseous) or have continued, forceful vomiting.  You have dizziness or unsteadiness that is getting worse.  You have severe, continued headaches not  relieved by medicine. Only take over-the-counter or prescription medicines for pain, fever, or discomfort as directed by your health care provider.  You do not have normal function of the arms or legs or are unable to walk.  You notice changes in the black spots in the center of the colored part of your eye (pupil).  You have a clear or bloody fluid coming from your nose or ears.  You have a loss of vision. During the next 24 hours after the injury, you must stay with someone who can watch you for the warning signs. This person should contact local emergency services (911 in the [place] if you have seizures, you become unconscious, or you are unable to wake up. HOW CAN I PREVENT A HEAD INJURY IN THE FUTURE? The most important factor for preventing major head injuries is avoiding motor vehicle accidents. To minimize the potential for damage to your head, it is crucial to wear seat belts while riding in motor vehicles. Wearing helmets while bike riding and playing collision sports (like football) is also helpful. Also, avoiding dangerous activities around the house will further help reduce your risk of head injury.  WHEN CAN I RETURN TO NORMAL ACTIVITIES AND ATHLETICS? You should be reevaluated by your health care provider before returning to these activities. If you have any of the following symptoms, you should not return to activities or contact sports until 1 week after the symptoms have stopped:  Persistent headache.  Dizziness or vertigo.  Poor attention and concentration.  Confusion.  Memory problems.  Nausea or vomiting.  Fatigue or tire easily.  Irritability.  Intolerant of bright lights or loud noises.  Anxiety or depression.  Disturbed sleep. MAKE SURE YOU:   Understand these instructions.  Will watch your condition.  Will get help right away if you are not doing well or get worse. Document Released: 02/06/2005 Document Revised: 11/27/2012 Document Reviewed:  10/14/2012 [facility] Patient Information 2014 [place].   Take Tylenol as needed for pain Try to be more careful with climbing and carrying groceries in each arm

## 2013-04-21 NOTE — Telephone Encounter (Signed)
Patient is coming in today to get B12 shot

## 2013-04-21 NOTE — Telephone Encounter (Signed)
RX called in per DW[facility]

## 2013-05-09 ENCOUNTER — Telehealth: Payer: Self-pay | Admitting: *Deleted

## 2013-05-09 MED ORDER — MEMANTINE HCL ER 14 MG PO CP24: 1.0000 | ORAL_CAPSULE | Freq: Every day | ORAL | Status: DC

## 2013-05-09 NOTE — Telephone Encounter (Signed)
Changed namenda to the namenda XR and only needs to take 1 time daily

## 2013-05-09 NOTE — Telephone Encounter (Signed)
Pharmacy sent request for Namenda. What she has listed is the starter pack. Can you send in for regular rx. Please advise

## 2013-05-21 ENCOUNTER — Other Ambulatory Visit (INDEPENDENT_AMBULATORY_CARE_PROVIDER_SITE_OTHER): Payer: Medicare Other

## 2013-05-21 DIAGNOSIS — R5381 Other malaise: Secondary | ICD-10-CM | POA: Diagnosis not present

## 2013-05-21 DIAGNOSIS — E785 Hyperlipidemia, unspecified: Secondary | ICD-10-CM | POA: Diagnosis not present

## 2013-05-21 DIAGNOSIS — R5383 Other fatigue: Principal | ICD-10-CM

## 2013-05-21 DIAGNOSIS — I10 Essential (primary) hypertension: Secondary | ICD-10-CM

## 2013-05-21 DIAGNOSIS — E039 Hypothyroidism, unspecified: Secondary | ICD-10-CM | POA: Diagnosis not present

## 2013-05-21 DIAGNOSIS — E559 Vitamin D deficiency, unspecified: Secondary | ICD-10-CM

## 2013-05-21 LAB — POCT CBC
GRANULOCYTE PERCENT: 70.4 % (ref 37–80)
HEMATOCRIT: 40 % (ref 37.7–47.9)
Hemoglobin: 13.1 g/dL (ref 12.2–16.2)
Lymph, poc: 1.6 (ref 0.6–3.4)
MCH, POC: 29.4 pg (ref 27–31.2)
MCHC: 32.8 g/dL (ref 31.8–35.4)
MCV: 89.7 fL (ref 80–97)
MPV: 9.2 fL (ref 0–99.8)
PLATELET COUNT, POC: 179 10*3/uL (ref 142–424)
POC Granulocyte: 4.7 (ref 2–6.9)
POC LYMPH %: 24.6 % (ref 10–50)
RBC: 4.5 M/uL (ref 4.04–5.48)
RDW, POC: 14.2 %
WBC: 6.7 10*3/uL (ref 4.6–10.2)

## 2013-05-21 NOTE — Progress Notes (Signed)
Patient came in for labs only.

## 2013-05-27 LAB — HEPATIC FUNCTION PANEL
ALT: 9 IU/L (ref 0–32)
AST: 16 IU/L (ref 0–40)
Albumin: 3.7 g/dL (ref 3.2–4.6)
Alkaline Phosphatase: 31 IU/L — ABNORMAL LOW (ref 39–117)
Bilirubin, Direct: 0.1 mg/dL (ref 0.00–0.40)
TOTAL PROTEIN: 6.2 g/dL (ref 6.0–8.5)
Total Bilirubin: 0.3 mg/dL (ref 0.0–1.2)

## 2013-05-27 LAB — NMR, LIPOPROFILE
CHOLESTEROL: 132 mg/dL (ref <200)
HDL Cholesterol by NMR: 66 mg/dL (ref >=40)
HDL Particle Number: 38.4 umol/L (ref >=30.5)
LDL Particle Number: 311 nmol/L (ref <1000)
LDLC SERPL CALC-MCNC: 41 mg/dL (ref <100)
Small LDL Particle Number: <90 nmol/L (ref <=527)
Triglycerides by NMR: 124 mg/dL (ref <150)

## 2013-05-27 LAB — BMP8+EGFR
BUN/Creatinine Ratio: 24 (ref 11–26)
BUN: 21 mg/dL (ref 10–36)
CHLORIDE: 106 mmol/L (ref 97–108)
CO2: 25 mmol/L (ref 18–29)
Calcium: 8.7 mg/dL (ref 8.7–10.3)
Creatinine, Ser: 0.88 mg/dL (ref 0.57–1.00)
GFR calc non Af Amer: 58 mL/min/{1.73_m2} — ABNORMAL LOW (ref >59)
GFR, EST AFRICAN AMERICAN: 67 mL/min/{1.73_m2} (ref >59)
GLUCOSE: 79 mg/dL (ref 65–99)
POTASSIUM: 5.1 mmol/L (ref 3.5–5.2)
Sodium: 144 mmol/L (ref 134–144)

## 2013-05-27 LAB — THYROID PANEL WITH TSH
FREE THYROXINE INDEX: 3.7 (ref 1.2–4.9)
T3 Uptake Ratio: 30 % (ref 24–39)
T4, Total: 12.3 ug/dL — ABNORMAL HIGH (ref 4.5–12.0)
TSH: 0.007 u[IU]/mL — ABNORMAL LOW (ref 0.450–4.500)

## 2013-05-27 LAB — VITAMIN D 25 HYDROXY (VIT D DEFICIENCY, FRACTURES): Vit D, 25-Hydroxy: 41.7 ng/mL (ref 30.0–100.0)

## 2013-06-04 ENCOUNTER — Ambulatory Visit (INDEPENDENT_AMBULATORY_CARE_PROVIDER_SITE_OTHER): Payer: Medicare Other | Admitting: Family Medicine

## 2013-06-04 ENCOUNTER — Ambulatory Visit (INDEPENDENT_AMBULATORY_CARE_PROVIDER_SITE_OTHER): Payer: Medicare Other

## 2013-06-04 ENCOUNTER — Encounter: Payer: Self-pay | Admitting: Family Medicine

## 2013-06-04 ENCOUNTER — Other Ambulatory Visit: Payer: Self-pay

## 2013-06-04 VITALS — BP 121/60 | HR 59 | Temp 96.6°F | Ht 60.0 in | Wt 119.0 lb

## 2013-06-04 DIAGNOSIS — I10 Essential (primary) hypertension: Secondary | ICD-10-CM

## 2013-06-04 DIAGNOSIS — Z13820 Encounter for screening for osteoporosis: Secondary | ICD-10-CM

## 2013-06-04 DIAGNOSIS — Z23 Encounter for immunization: Secondary | ICD-10-CM

## 2013-06-04 DIAGNOSIS — R413 Other amnesia: Secondary | ICD-10-CM

## 2013-06-04 DIAGNOSIS — E0590 Thyrotoxicosis, unspecified without thyrotoxic crisis or storm: Secondary | ICD-10-CM | POA: Diagnosis not present

## 2013-06-04 DIAGNOSIS — Z85850 Personal history of malignant neoplasm of thyroid: Secondary | ICD-10-CM | POA: Diagnosis not present

## 2013-06-04 DIAGNOSIS — M1990 Unspecified osteoarthritis, unspecified site: Secondary | ICD-10-CM

## 2013-06-04 DIAGNOSIS — E538 Deficiency of other specified B group vitamins: Secondary | ICD-10-CM | POA: Diagnosis not present

## 2013-06-04 DIAGNOSIS — E785 Hyperlipidemia, unspecified: Secondary | ICD-10-CM

## 2013-06-04 DIAGNOSIS — M79672 Pain in left foot: Secondary | ICD-10-CM

## 2013-06-04 DIAGNOSIS — M79609 Pain in unspecified limb: Secondary | ICD-10-CM

## 2013-06-04 MED ORDER — MEMANTINE HCL ER 21 MG PO CP24: 1.0000 | ORAL_CAPSULE | Freq: Every day | ORAL | Status: DC

## 2013-06-04 NOTE — Progress Notes (Signed)
Subjective:    Patient ID: Bianca Thompson, female    DOB: 02-Nov-1920, 80 y.o.   MRN: [id number]  HPI Pt here for follow up and management of chronic medical problems. The patient comes today to visit with her daughter-in-law and husband. The patient's lab work was reviewed with her daughter-in-law and with her today. The daughter-in-law is help the patient with her medications so that she takes it correctly. As a result of reviewing the lab work we had decided to leave off the cholesterol medicine for the next few months because all of her numbers were so low. The patient and her daughter-in-law are aware of this. She will get a chest x-ray today and orders have been put in for a DEXA scan. She will also receive a Prevnar vaccine and a B12 shot.         Patient Active Problem List   Diagnosis Date Noted  . History of thyroid cancer 08/22/2012  . Hyperlipidemia 08/22/2012  . Hypertension 08/22/2012  . Hyperthyroidism 08/22/2012  . Osteoporosis 08/22/2012  . Memory deficit 08/22/2012  . Degenerative arthritis of the lumbar spine 08/22/2012   Outpatient Encounter Prescriptions as of 06/04/2013  Medication Sig  . amLODipine (NORVASC) 5 MG tablet TAKE ONE HALF TABLET BY MOUTH EVERY DAY  . Ascorbic Acid (VITAMIN C) 500 MG tablet Take 500 mg by mouth daily.    Marland Kitchen aspirin (SB LOW DOSE ASA EC) 81 MG EC tablet Take 81 mg by mouth daily.    . calcium-vitamin D (OSCAL WITH D) 500-200 MG-UNIT per tablet Take 1 tablet by mouth daily.    . Cholecalciferol (VITAMIN D) 2000 UNITS CAPS Take by mouth. Take 2 tablets by mouth Saturday and Sunday . Then on Monday -Friday take one tablet by mouth   . donepezil (ARICEPT) 10 MG tablet TAKE ONE TABLET BY MOUTH ONE TIME DAILY  . dorzolamide-timolol (COSOPT) 22.3-6.8 MG/ML ophthalmic solution   . Garlic 0938 MG CAPS Take 1,000 capsules by mouth daily.  Marland Kitchen latanoprost (XALATAN) 0.005 % ophthalmic solution   . Memantine HCl ER (NAMENDA XR) 14 MG CP24 Take 1 tablet  (14 mg total) by mouth daily.  . Omega-3 Fatty Acids (FISH OIL) 1200 MG CAPS Take 2 capsules by mouth daily. Take 2-3 tablets by mouth daily  . quinapril (ACCUPRIL) 40 MG tablet TAKE ONE TABLET BY MOUTH ONE TIME DAILY  . raloxifene (EVISTA) 60 MG tablet TAKE ONE TABLET BY MOUTH ONCE DAILY  . SYNTHROID 112 MCG tablet TAKE ONE TABLET BY MOUTH ONE TIME DAILY  . ezetimibe-simvastatin (VYTORIN) 10-20 MG per tablet Take 1 tablet by mouth at bedtime.    Review of Systems  Constitutional: Negative.   HENT: Negative.   Eyes: Negative.   Respiratory: Negative.   Cardiovascular: Negative.   Gastrointestinal: Negative.   Endocrine: Negative.   Genitourinary: Negative.        Having trouble with bladder dropping at times- goes to Dr Jeffie Pollock about this.  Musculoskeletal: Negative.   Skin: Negative.   Allergic/Immunologic: Negative.   Neurological: Negative.   Hematological: Negative.   Psychiatric/Behavioral: Negative.  Confusion: seems to be better.       Objective:   Physical Exam  Nursing note and vitals reviewed. Constitutional: She is oriented to person, place, and time. She appears well-developed and well-nourished. No distress.  Patient looks appropriate and is doing well for her stated age of 54.  HENT:  Head: Normocephalic and atraumatic.  Right Ear: External ear normal.  Left  Ear: External ear normal.  Nose: Nose normal.  Mouth/Throat: Oropharynx is clear and moist.  Eyes: Conjunctivae and EOM are normal. Pupils are equal, round, and reactive to light. Right eye exhibits no discharge. Left eye exhibits no discharge. No scleral icterus.  Neck: Normal range of motion. Neck supple. No JVD present. No thyromegaly present.  Cardiovascular: Normal rate, regular rhythm, normal heart sounds and intact distal pulses.  Exam reveals no gallop and no friction rub.   No murmur heard. At 72 per minute  Pulmonary/Chest: Effort normal and breath sounds normal. She has no wheezes. She has no  rales.  Abdominal: Soft. Bowel sounds are normal. She exhibits no mass. There is no tenderness. There is no rebound and no guarding.  Musculoskeletal: Normal range of motion. She exhibits no edema and no tenderness.  She has deformities in both feet and indicates that these are painful especially with walking appear  Lymphadenopathy:    She has no cervical adenopathy.  Neurological: She is alert and oriented to person, place, and time. She has normal reflexes.  Skin: Skin is warm and dry. No rash noted.  Psychiatric: She has a normal mood and affect. Her behavior is normal. Judgment and thought content normal.  Some memory deficit is apparent but no worse than usual   BP 121/60  Pulse 59  Temp(Src) 96.6 F (35.9 C) (Oral)  Ht 5' (1.524 m)  Wt 119 lb (53.978 kg)  BMI 23.24 kg/m2  The patient will see a Prevnar vaccine today and her B12 injection. [facility] reading (PRIMARY) by  Dr. Brunilda Payor x-ray--no active disease, degenerative changes in thoracic spine                                      Assessment & Plan:  1. Hyperlipidemia -Hold Cholesterol medication until next visit  2. Hypertension  3. Hyperthyroidism  4. History of thyroid cancer  5. Left foot pain -Refer to orthopedist  6. Memory deficit -Increase Namenda to therapeutic dose level   Patient Instructions                       Medicare Annual Wellness Visit  [facility] and the medical providers at [facility] strive to bring you the best medical care.  In doing so we not only want to address your current medical conditions and concerns but also to detect new conditions early and prevent illness, disease and health-related problems.    Medicare offers a yearly Wellness Visit which allows our clinical staff to assess your need for preventative services including immunizations, lifestyle education, counseling to decrease risk of preventable diseases and screening for fall risk and other medical  concerns.    This visit is provided free of charge (no copay) for all Medicare recipients. The clinical pharmacists at [facility] have begun to conduct these Wellness Visits which will also include a thorough review of all your medications.    As you primary medical provider recommend that you make an appointment for your Annual Wellness Visit if you have not done so already this year.  You may set up this appointment before you leave today or you may call back ([phone]) and schedule an appointment.  Please make sure when you call that you mention that you are scheduling your Annual Wellness Visit with the clinical pharmacist so that the appointment may be made  for the proper length of time.       Continue current medications. Cholesterol medicine as directed. We will be increasing your Namenda once you have completed the current pills Continue good therapeutic lifestyle changes which include good diet and exercise. Fall precautions discussed with patient. If an FOBT was given today- please return it to our front desk. If you are over 22 years old - you may need Prevnar 24 or the adult Pneumonia vaccine. Drink plenty of fluids We will also arrange for you to see the orthopedic specialist for the problems you're having with your feet  Monitor blood pressures at home   Arrie Senate MD

## 2013-06-04 NOTE — Patient Instructions (Addendum)
Medicare Annual Wellness Visit  [facility] and the medical providers at [facility] strive to bring you the best medical care.  In doing so we not only want to address your current medical conditions and concerns but also to detect new conditions early and prevent illness, disease and health-related problems.    Medicare offers a yearly Wellness Visit which allows our clinical staff to assess your need for preventative services including immunizations, lifestyle education, counseling to decrease risk of preventable diseases and screening for fall risk and other medical concerns.    This visit is provided free of charge (no copay) for all Medicare recipients. The clinical pharmacists at [facility] have begun to conduct these Wellness Visits which will also include a thorough review of all your medications.    As you primary medical provider recommend that you make an appointment for your Annual Wellness Visit if you have not done so already this year.  You may set up this appointment before you leave today or you may call back ([phone]) and schedule an appointment.  Please make sure when you call that you mention that you are scheduling your Annual Wellness Visit with the clinical pharmacist so that the appointment may be made for the proper length of time.       Continue current medications. Cholesterol medicine as directed. We will be increasing your Namenda once you have completed the current pills Continue good therapeutic lifestyle changes which include good diet and exercise. Fall precautions discussed with patient. If an FOBT was given today- please return it to our front desk. If you are over 70 years old - you may need Prevnar 65 or the adult Pneumonia vaccine. Drink plenty of fluids We will also arrange for you to see the orthopedic specialist for the problems you're having with your feet  Monitor blood pressures at  home

## 2013-06-04 NOTE — Addendum Note (Signed)
Addended by: Zannie Cove on: 06/04/2013 05:20 PM   Modules accepted: Orders

## 2013-06-10 DIAGNOSIS — M129 Arthropathy, unspecified: Secondary | ICD-10-CM | POA: Diagnosis not present

## 2013-06-11 NOTE — Telephone Encounter (Signed)
Taken care of

## 2013-06-17 DIAGNOSIS — Z1231 Encounter for screening mammogram for malignant neoplasm of breast: Secondary | ICD-10-CM | POA: Diagnosis not present

## 2013-06-24 DIAGNOSIS — M19079 Primary osteoarthritis, unspecified ankle and foot: Secondary | ICD-10-CM | POA: Diagnosis not present

## 2013-07-11 ENCOUNTER — Other Ambulatory Visit: Payer: Self-pay | Admitting: *Deleted

## 2013-07-11 ENCOUNTER — Other Ambulatory Visit: Payer: Self-pay | Admitting: Family Medicine

## 2013-07-11 MED ORDER — MEMANTINE HCL ER 28 MG PO CP24: 1.0000 | ORAL_CAPSULE | Freq: Every day | ORAL | Status: DC

## 2013-07-30 ENCOUNTER — Ambulatory Visit (INDEPENDENT_AMBULATORY_CARE_PROVIDER_SITE_OTHER): Payer: Medicare Other

## 2013-07-30 ENCOUNTER — Ambulatory Visit (INDEPENDENT_AMBULATORY_CARE_PROVIDER_SITE_OTHER): Payer: Medicare Other | Admitting: Pharmacist

## 2013-07-30 ENCOUNTER — Encounter: Payer: Self-pay | Admitting: Pharmacist

## 2013-07-30 VITALS — BP 136/68 | HR 66 | Ht 60.0 in | Wt 120.0 lb

## 2013-07-30 DIAGNOSIS — M810 Age-related osteoporosis without current pathological fracture: Secondary | ICD-10-CM

## 2013-07-30 DIAGNOSIS — Z13820 Encounter for screening for osteoporosis: Secondary | ICD-10-CM

## 2013-07-30 LAB — HM DEXA SCAN

## 2013-07-30 MED ORDER — CALCIUM CARBONATE-VITAMIN D 500-200 MG-UNIT PO TABS: 1.0000 | ORAL_TABLET | Freq: Two times a day (BID) | ORAL | Status: DC

## 2013-07-30 NOTE — Patient Instructions (Signed)

## 2013-07-30 NOTE — Progress Notes (Signed)
Patient ID: Bianca Thompson, female   DOB: August 02, 1920, 80 y.o.   MRN: [id number] [facility] Current Height: Height: 5' (152.4 cm)      Current Weight: Weight: 120 lb (54.432 kg)       Ethnicity:Caucasian  BP: BP: 136/68 mmHg     HR:  Pulse Rate: 66      HPI: Patient with osteoporosis. She has tried several treatments but currently is taking generic evista / raloxifene 60mg  daily for the last 2 years   Back Pain?  No       Kyphosis?  Yes Prior fracture?  Yes - arm at 80yo                                                             PMH: Age at menopause:  27's Hysterectomy?  No Oophorectomy?  No HRT? No Steroid Use?  No Thyroid med?  Yes History of cancer?  Yes - thyroid cancer History of digestive disorders (ie Crohn's)?  No Current or previous eating disorders?  No Last Vitamin D Result:  41.7 (05/21/2013) Last GFR Result:  58 (05/21/2013)   FH/SH: Family history of osteoporosis?  No Parent with history of hip fracture?  No Family history of breast cancer?  No Exercise?  Yes - a little Smoking?  No Alcohol?  No    Calcium Assessment Calcium Intake  # of servings/day  Calcium mg  Milk (8 oz) 0  x  300  = 0  Yogurt (4 oz) 0 x  200 = 0  Cheese (1 oz) 0 x  200 = 0  Other Calcium sources   250mg   Ca supplement 500mg  once daily = 500mg    Estimated calcium intake per day 750mg     DEXA Results Date of Test T-Score for AP Spine L1-L4 T-Score for Total Left Hip T-Score for Total Right Hip  07/30/2013 -0.3 -1.4 -1.8  04/26/2011 0.5 -1.2 -1.8  06/17/2008 0.6 -0.7 -1.2  01/29/2006 0.6 -0.5 -0.7  T-Score today for neck of left  Hip = -2.6 T-Score today for neck of right Hip  = -2.7  Assessment Osteoporosis with non significant BMD changes.  Intolerant to bisphosphonates and low calcium intake  Recommendations: 1.  Continue Evista 60mg  1 tablet daily  2.  recommend calcium 1200mg  daily through supplementation or diet.  3.  recommend weight bearing exercise - 30  minutes at least 4 days per week.   4.  Counseled and educated about fall risk and prevention.  Recheck DEXA:  2 years  Time spent counseling patient:  20 minutes  Cherre Robins, PharmD, CPP

## 2013-07-31 DIAGNOSIS — H3530 Unspecified macular degeneration: Secondary | ICD-10-CM | POA: Diagnosis not present

## 2013-07-31 DIAGNOSIS — H5000 Unspecified esotropia: Secondary | ICD-10-CM | POA: Diagnosis not present

## 2013-07-31 DIAGNOSIS — H4011X0 Primary open-angle glaucoma, stage unspecified: Secondary | ICD-10-CM | POA: Diagnosis not present

## 2013-07-31 DIAGNOSIS — H409 Unspecified glaucoma: Secondary | ICD-10-CM | POA: Diagnosis not present

## 2013-08-11 ENCOUNTER — Other Ambulatory Visit: Payer: Self-pay | Admitting: Pharmacist

## 2013-08-11 ENCOUNTER — Telehealth: Payer: Self-pay | Admitting: Family Medicine

## 2013-08-11 ENCOUNTER — Other Ambulatory Visit: Payer: Self-pay | Admitting: Family Medicine

## 2013-08-11 MED ORDER — AMLODIPINE BESYLATE 5 MG PO TABS: ORAL_TABLET | ORAL | Status: DC

## 2013-08-11 NOTE — Telephone Encounter (Signed)
done

## 2013-08-25 DIAGNOSIS — H409 Unspecified glaucoma: Secondary | ICD-10-CM | POA: Diagnosis not present

## 2013-08-25 DIAGNOSIS — H4011X0 Primary open-angle glaucoma, stage unspecified: Secondary | ICD-10-CM | POA: Diagnosis not present

## 2013-09-01 ENCOUNTER — Other Ambulatory Visit: Payer: Self-pay | Admitting: Family Medicine

## 2013-09-13 ENCOUNTER — Other Ambulatory Visit: Payer: Self-pay | Admitting: Family Medicine

## 2013-09-30 ENCOUNTER — Other Ambulatory Visit: Payer: Self-pay | Admitting: Family Medicine

## 2013-10-01 NOTE — Telephone Encounter (Signed)
Last ov 4/15. 

## 2013-10-07 ENCOUNTER — Other Ambulatory Visit (INDEPENDENT_AMBULATORY_CARE_PROVIDER_SITE_OTHER): Payer: Medicare Other

## 2013-10-07 DIAGNOSIS — I10 Essential (primary) hypertension: Secondary | ICD-10-CM | POA: Diagnosis not present

## 2013-10-07 DIAGNOSIS — E559 Vitamin D deficiency, unspecified: Secondary | ICD-10-CM | POA: Diagnosis not present

## 2013-10-07 DIAGNOSIS — E785 Hyperlipidemia, unspecified: Secondary | ICD-10-CM | POA: Diagnosis not present

## 2013-10-08 LAB — NMR, LIPOPROFILE
Cholesterol: 179 mg/dL (ref 100–199)
HDL Cholesterol by NMR: 70 mg/dL (ref >39)
HDL Particle Number: 33 umol/L (ref >=30.5)
LDL Particle Number: 988 nmol/L (ref <1000)
LDL SIZE: 21.1 nm (ref >20.5)
LDLC SERPL CALC-MCNC: 89 mg/dL (ref 0–99)
LP-IR Score: 26 (ref <=45)
SMALL LDL PARTICLE NUMBER: 174 nmol/L (ref <=527)
Triglycerides by NMR: 98 mg/dL (ref 0–149)

## 2013-10-08 LAB — BMP8+EGFR
BUN/Creatinine Ratio: 24 (ref 11–26)
BUN: 24 mg/dL (ref 10–36)
CALCIUM: 8.8 mg/dL (ref 8.7–10.3)
CO2: 24 mmol/L (ref 18–29)
Chloride: 106 mmol/L (ref 97–108)
Creatinine, Ser: 1.01 mg/dL — ABNORMAL HIGH (ref 0.57–1.00)
GFR calc Af Amer: 56 mL/min/{1.73_m2} — ABNORMAL LOW (ref >59)
GFR calc non Af Amer: 49 mL/min/{1.73_m2} — ABNORMAL LOW (ref >59)
Glucose: 83 mg/dL (ref 65–99)
POTASSIUM: 4.8 mmol/L (ref 3.5–5.2)
SODIUM: 144 mmol/L (ref 134–144)

## 2013-10-08 LAB — HEPATIC FUNCTION PANEL
ALBUMIN: 3.9 g/dL (ref 3.2–4.6)
ALT: 8 IU/L (ref 0–32)
AST: 18 IU/L (ref 0–40)
Alkaline Phosphatase: 32 IU/L — ABNORMAL LOW (ref 39–117)
Bilirubin, Direct: 0.07 mg/dL (ref 0.00–0.40)
TOTAL PROTEIN: 6.1 g/dL (ref 6.0–8.5)
Total Bilirubin: 0.3 mg/dL (ref 0.0–1.2)

## 2013-10-08 LAB — VITAMIN D 25 HYDROXY (VIT D DEFICIENCY, FRACTURES): Vit D, 25-Hydroxy: 56.6 ng/mL (ref 30.0–100.0)

## 2013-10-13 ENCOUNTER — Ambulatory Visit (INDEPENDENT_AMBULATORY_CARE_PROVIDER_SITE_OTHER): Payer: Medicare Other | Admitting: Family Medicine

## 2013-10-13 ENCOUNTER — Encounter: Payer: Self-pay | Admitting: Family Medicine

## 2013-10-13 VITALS — BP 132/58 | HR 73 | Temp 97.8°F | Ht 60.0 in | Wt 117.0 lb

## 2013-10-13 DIAGNOSIS — E785 Hyperlipidemia, unspecified: Secondary | ICD-10-CM | POA: Diagnosis not present

## 2013-10-13 DIAGNOSIS — I10 Essential (primary) hypertension: Secondary | ICD-10-CM

## 2013-10-13 DIAGNOSIS — R413 Other amnesia: Secondary | ICD-10-CM

## 2013-10-13 DIAGNOSIS — E0590 Thyrotoxicosis, unspecified without thyrotoxic crisis or storm: Secondary | ICD-10-CM

## 2013-10-13 DIAGNOSIS — Z85850 Personal history of malignant neoplasm of thyroid: Secondary | ICD-10-CM

## 2013-10-13 NOTE — Progress Notes (Signed)
Subjective:    Patient ID: Bianca Thompson, female    DOB: 1921/01/26, 80 y.o.   MRN: [id number]  HPI Pt here for follow up and management of chronic medical problems. The patient comes to the visit today with her husband. She has lab work which we will review she will also be given FOBT to return. She has no complaints and no refills are needed on any of her medications.  Her appearance is good.        Patient Active Problem List   Diagnosis Date Noted  . History of thyroid cancer 08/22/2012  . Hyperlipidemia 08/22/2012  . Hypertension 08/22/2012  . Hyperthyroidism 08/22/2012  . Osteoporosis 08/22/2012  . Memory deficit 08/22/2012  . Degenerative arthritis of the lumbar spine 08/22/2012   Outpatient Encounter Prescriptions as of 10/13/2013  Medication Sig  . amLODipine (NORVASC) 5 MG tablet TAKE ONE HALF TABLET BY MOUTH EVERY DAY  . Ascorbic Acid (VITAMIN C) 500 MG tablet Take 500 mg by mouth daily.    Marland Kitchen aspirin (SB LOW DOSE ASA EC) 81 MG EC tablet Take 81 mg by mouth daily.    . calcium-vitamin D (OSCAL WITH D) 500-200 MG-UNIT per tablet Take 1 tablet by mouth 2 (two) times daily.  . Cholecalciferol (VITAMIN D) 2000 UNITS CAPS Take by mouth. Take 2 tablets by mouth Saturday and Sunday . Then on Monday -Friday take one tablet by mouth   . donepezil (ARICEPT) 10 MG tablet TAKE ONE TABLET BY MOUTH ONE TIME DAILY  . dorzolamide-timolol (COSOPT) 22.3-6.8 MG/ML ophthalmic solution   . ezetimibe-simvastatin (VYTORIN) 10-20 MG per tablet Take 1 tablet by mouth at bedtime.  . Garlic 0258 MG CAPS Take 1,000 capsules by mouth daily.  Marland Kitchen latanoprost (XALATAN) 0.005 % ophthalmic solution   . levothyroxine (SYNTHROID, LEVOTHROID) 112 MCG tablet TAKE ONE TABLET BY MOUTH ONCE DAILY  . NAMENDA XR 28 MG CP24 TAKE ONE CAPSULE BY MOUTH ONCE DAILY AT BEDTIME  . Omega-3 Fatty Acids (FISH OIL) 1200 MG CAPS Take 2 capsules by mouth daily. Take 2-3 tablets by mouth daily  . quinapril (ACCUPRIL) 40 MG  tablet TAKE ONE TABLET BY MOUTH ONCE DAILY  . raloxifene (EVISTA) 60 MG tablet TAKE ONE TABLET BY MOUTH ONCE DAILY  . simvastatin (ZOCOR) 20 MG tablet Take 20 mg by mouth daily.    Review of Systems  Constitutional: Negative.   HENT: Negative.   Eyes: Negative.   Respiratory: Negative.   Cardiovascular: Negative.   Gastrointestinal: Negative.   Endocrine: Negative.   Genitourinary: Negative.   Musculoskeletal: Negative.   Skin: Negative.   Allergic/Immunologic: Negative.   Neurological: Negative.   Hematological: Negative.   Psychiatric/Behavioral: Negative.        Objective:   Physical Exam  Nursing note and vitals reviewed. Constitutional: She is oriented to person, place, and time. She appears well-nourished. No distress.   The patient is somewhat frail but appearance looks younger than her stated age of 66  HENT:  Head: Normocephalic and atraumatic.  Right Ear: External ear normal.  Left Ear: External ear normal.  Nose: Nose normal.  Mouth/Throat: Oropharynx is clear and moist.  Eyes: Conjunctivae and EOM are normal. Pupils are equal, round, and reactive to light. Right eye exhibits no discharge. Left eye exhibits no discharge. No scleral icterus.  Neck: Normal range of motion. Neck supple. No JVD present. No thyromegaly present.  No carotid bruits or adenopathy  Cardiovascular: Normal rate, regular rhythm, normal heart sounds and intact  distal pulses.  Exam reveals no gallop and no friction rub.   No murmur heard. At 72 per minute  Pulmonary/Chest: Effort normal and breath sounds normal. No respiratory distress. She has no wheezes. She has no rales. She exhibits no tenderness.  No axillary adenopathy  Abdominal: Soft. Bowel sounds are normal. She exhibits no mass. There is no tenderness. There is no rebound and no guarding.  No abdominal bruits  Musculoskeletal: Normal range of motion. She exhibits no edema and no tenderness.  Bony deformities in both feet    Lymphadenopathy:    She has no cervical adenopathy.  Neurological: She is alert and oriented to person, place, and time. She has normal reflexes. No cranial nerve deficit.  The patient's memory is more obvious than in the past. Her delay in responding to questions seem to be more than in the past.  Skin: Skin is warm and dry. No rash noted. No erythema.  Psychiatric: She has a normal mood and affect. Her behavior is normal. Judgment and thought content normal.   BP 132/58  Pulse 73  Temp(Src) 97.8 F (36.6 C) (Oral)  Ht 5' (1.524 m)  Wt 117 lb (53.071 kg)  BMI 22.85 kg/m2        Assessment & Plan:  1. Hyperlipidemia  2. Essential hypertension  3. Hyperthyroidism  4. Memory deficit  5. History of thyroid cancer -Continue current treatment  Patient Instructions                       Medicare Annual Wellness Visit  [facility] and the medical providers at [facility] strive to bring you the best medical care.  In doing so we not only want to address your current medical conditions and concerns but also to detect new conditions early and prevent illness, disease and health-related problems.    Medicare offers a yearly Wellness Visit which allows our clinical staff to assess your need for preventative services including immunizations, lifestyle education, counseling to decrease risk of preventable diseases and screening for fall risk and other medical concerns.    This visit is provided free of charge (no copay) for all Medicare recipients. The clinical pharmacists at [facility] have begun to conduct these Wellness Visits which will also include a thorough review of all your medications.    As you primary medical provider recommend that you make an appointment for your Annual Wellness Visit if you have not done so already this year.  You may set up this appointment before you leave today or you may call back ([phone]) and schedule  an appointment.  Please make sure when you call that you mention that you are scheduling your Annual Wellness Visit with the clinical pharmacist so that the appointment may be made for the proper length of time.     Continue current medications. Continue good therapeutic lifestyle changes which include good diet and exercise. Fall precautions discussed with patient. If an FOBT was given today- please return it to our front desk. If you are over 30 years old - you may need Prevnar 29 or the adult Pneumonia vaccine.  Flu Shots will be available at our office starting mid- September. Please call and schedule a FLU CLINIC APPOINTMENT.   Continue to try and drink plenty of fluids Continue to be careful and to not put herself at risk for falling Return  the FOBT   Arrie Senate MD

## 2013-10-13 NOTE — Patient Instructions (Addendum)
Medicare Annual Wellness Visit  [facility] and the medical providers at [facility] strive to bring you the best medical care.  In doing so we not only want to address your current medical conditions and concerns but also to detect new conditions early and prevent illness, disease and health-related problems.    Medicare offers a yearly Wellness Visit which allows our clinical staff to assess your need for preventative services including immunizations, lifestyle education, counseling to decrease risk of preventable diseases and screening for fall risk and other medical concerns.    This visit is provided free of charge (no copay) for all Medicare recipients. The clinical pharmacists at [facility] have begun to conduct these Wellness Visits which will also include a thorough review of all your medications.    As you primary medical provider recommend that you make an appointment for your Annual Wellness Visit if you have not done so already this year.  You may set up this appointment before you leave today or you may call back ([phone]) and schedule an appointment.  Please make sure when you call that you mention that you are scheduling your Annual Wellness Visit with the clinical pharmacist so that the appointment may be made for the proper length of time.     Continue current medications. Continue good therapeutic lifestyle changes which include good diet and exercise. Fall precautions discussed with patient. If an FOBT was given today- please return it to our front desk. If you are over 5 years old - you may need Prevnar 51 or the adult Pneumonia vaccine.  Flu Shots will be available at our office starting mid- September. Please call and schedule a FLU CLINIC APPOINTMENT.   Continue to try and drink plenty of fluids Continue to be careful and to not put herself at risk for falling Return  the FOBT

## 2013-10-21 DIAGNOSIS — N3642 Intrinsic sphincter deficiency (ISD): Secondary | ICD-10-CM | POA: Diagnosis not present

## 2013-10-21 DIAGNOSIS — N3000 Acute cystitis without hematuria: Secondary | ICD-10-CM | POA: Diagnosis not present

## 2013-10-21 DIAGNOSIS — N952 Postmenopausal atrophic vaginitis: Secondary | ICD-10-CM | POA: Diagnosis not present

## 2013-10-21 DIAGNOSIS — N8111 Cystocele, midline: Secondary | ICD-10-CM | POA: Diagnosis not present

## 2013-10-21 DIAGNOSIS — N3020 Other chronic cystitis without hematuria: Secondary | ICD-10-CM | POA: Diagnosis not present

## 2013-11-03 DIAGNOSIS — H409 Unspecified glaucoma: Secondary | ICD-10-CM | POA: Diagnosis not present

## 2013-11-03 DIAGNOSIS — H4011X0 Primary open-angle glaucoma, stage unspecified: Secondary | ICD-10-CM | POA: Diagnosis not present

## 2013-11-06 ENCOUNTER — Other Ambulatory Visit: Payer: Self-pay | Admitting: Family Medicine

## 2013-11-20 ENCOUNTER — Other Ambulatory Visit: Payer: Self-pay | Admitting: Family Medicine

## 2013-11-25 DIAGNOSIS — N3946 Mixed incontinence: Secondary | ICD-10-CM | POA: Diagnosis not present

## 2013-11-25 DIAGNOSIS — N3000 Acute cystitis without hematuria: Secondary | ICD-10-CM | POA: Diagnosis not present

## 2013-11-25 DIAGNOSIS — H4011X3 Primary open-angle glaucoma, severe stage: Secondary | ICD-10-CM | POA: Diagnosis not present

## 2013-12-02 ENCOUNTER — Ambulatory Visit (INDEPENDENT_AMBULATORY_CARE_PROVIDER_SITE_OTHER): Payer: Medicare Other

## 2013-12-02 DIAGNOSIS — Z23 Encounter for immunization: Secondary | ICD-10-CM | POA: Diagnosis not present

## 2013-12-04 ENCOUNTER — Telehealth: Payer: Self-pay | Admitting: Family Medicine

## 2013-12-04 ENCOUNTER — Other Ambulatory Visit: Payer: Self-pay | Admitting: Family Medicine

## 2013-12-05 ENCOUNTER — Other Ambulatory Visit: Payer: Self-pay | Admitting: *Deleted

## 2013-12-05 MED ORDER — DONEPEZIL HCL 10 MG PO TABS: ORAL_TABLET | ORAL | Status: DC

## 2013-12-05 MED ORDER — QUINAPRIL HCL 40 MG PO TABS: ORAL_TABLET | ORAL | Status: DC

## 2013-12-05 MED ORDER — AMLODIPINE BESYLATE 5 MG PO TABS: ORAL_TABLET | ORAL | Status: DC

## 2013-12-05 MED ORDER — RALOXIFENE HCL 60 MG PO TABS: ORAL_TABLET | ORAL | Status: DC

## 2013-12-05 MED ORDER — MEMANTINE HCL ER 28 MG PO CP24: ORAL_CAPSULE | ORAL | Status: DC

## 2013-12-05 MED ORDER — LEVOTHYROXINE SODIUM 112 MCG PO TABS: ORAL_TABLET | ORAL | Status: DC

## 2013-12-05 NOTE — Telephone Encounter (Signed)
Spoke with patient who states that she is wanting to change meds to a 90 day supply instead of 30. I called [facility] and refilled all meds for 90 days. Patient notified. Will notified [name] her caregiver that this has been done

## 2014-01-01 ENCOUNTER — Telehealth: Payer: Self-pay | Admitting: Family Medicine

## 2014-02-02 DIAGNOSIS — H4011X2 Primary open-angle glaucoma, moderate stage: Secondary | ICD-10-CM | POA: Diagnosis not present

## 2014-02-02 DIAGNOSIS — H4011X3 Primary open-angle glaucoma, severe stage: Secondary | ICD-10-CM | POA: Diagnosis not present

## 2014-02-16 ENCOUNTER — Other Ambulatory Visit: Payer: Self-pay | Admitting: *Deleted

## 2014-02-16 MED ORDER — EZETIMIBE-SIMVASTATIN 10-20 MG PO TABS: 1.0000 | ORAL_TABLET | Freq: Every day | ORAL | Status: DC

## 2014-02-16 MED ORDER — LEVOTHYROXINE SODIUM 112 MCG PO TABS: ORAL_TABLET | ORAL | Status: DC

## 2014-02-16 MED ORDER — DONEPEZIL HCL 10 MG PO TABS: ORAL_TABLET | ORAL | Status: DC

## 2014-02-16 MED ORDER — SIMVASTATIN 20 MG PO TABS: 20.0000 mg | ORAL_TABLET | Freq: Every day | ORAL | Status: DC

## 2014-02-16 MED ORDER — RALOXIFENE HCL 60 MG PO TABS: ORAL_TABLET | ORAL | Status: DC

## 2014-02-16 MED ORDER — AMLODIPINE BESYLATE 5 MG PO TABS: ORAL_TABLET | ORAL | Status: DC

## 2014-02-16 MED ORDER — MEMANTINE HCL ER 28 MG PO CP24: ORAL_CAPSULE | ORAL | Status: DC

## 2014-02-16 MED ORDER — QUINAPRIL HCL 40 MG PO TABS: ORAL_TABLET | ORAL | Status: DC

## 2014-02-17 ENCOUNTER — Other Ambulatory Visit (INDEPENDENT_AMBULATORY_CARE_PROVIDER_SITE_OTHER): Payer: Medicare Other

## 2014-02-17 DIAGNOSIS — E0590 Thyrotoxicosis, unspecified without thyrotoxic crisis or storm: Secondary | ICD-10-CM | POA: Diagnosis not present

## 2014-02-17 DIAGNOSIS — E538 Deficiency of other specified B group vitamins: Secondary | ICD-10-CM

## 2014-02-17 DIAGNOSIS — E785 Hyperlipidemia, unspecified: Secondary | ICD-10-CM | POA: Diagnosis not present

## 2014-02-17 DIAGNOSIS — E559 Vitamin D deficiency, unspecified: Secondary | ICD-10-CM | POA: Diagnosis not present

## 2014-02-17 DIAGNOSIS — I10 Essential (primary) hypertension: Secondary | ICD-10-CM | POA: Diagnosis not present

## 2014-02-17 LAB — POCT CBC
GRANULOCYTE PERCENT: 74.2 % (ref 37–80)
HCT, POC: 41.5 % (ref 37.7–47.9)
Hemoglobin: 13.3 g/dL (ref 12.2–16.2)
LYMPH, POC: 1.9 (ref 0.6–3.4)
MCH, POC: 28.7 pg (ref 27–31.2)
MCHC: 32.1 g/dL (ref 31.8–35.4)
MCV: 89.3 fL (ref 80–97)
MPV: 10.8 fL (ref 0–99.8)
PLATELET COUNT, POC: 161 10*3/uL (ref 142–424)
POC Granulocyte: 6.2 (ref 2–6.9)
POC LYMPH %: 23.1 % (ref 10–50)
RBC: 4.7 M/uL (ref 4.04–5.48)
RDW, POC: 13.9 %
WBC: 8.3 10*3/uL (ref 4.6–10.2)

## 2014-02-17 NOTE — Progress Notes (Signed)
Lab only 

## 2014-02-18 LAB — NMR, LIPOPROFILE
CHOLESTEROL: 179 mg/dL (ref 100–199)
HDL Cholesterol by NMR: 65 mg/dL (ref >39)
HDL Particle Number: 34.1 umol/L (ref >=30.5)
LDL PARTICLE NUMBER: 1064 nmol/L — AB (ref <1000)
LDL Size: 20.7 nm (ref >20.5)
LDL-C: 95 mg/dL (ref 0–99)
LP-IR Score: <25 (ref <=45)
Small LDL Particle Number: 480 nmol/L (ref <=527)
Triglycerides by NMR: 93 mg/dL (ref 0–149)

## 2014-02-18 LAB — HEPATIC FUNCTION PANEL
ALBUMIN: 4 g/dL (ref 3.2–4.6)
ALT: 10 IU/L (ref 0–32)
AST: 19 IU/L (ref 0–40)
Alkaline Phosphatase: 36 IU/L — ABNORMAL LOW (ref 39–117)
BILIRUBIN TOTAL: 0.2 mg/dL (ref 0.0–1.2)
Bilirubin, Direct: 0.07 mg/dL (ref 0.00–0.40)
Total Protein: 6.6 g/dL (ref 6.0–8.5)

## 2014-02-18 LAB — BMP8+EGFR
BUN/Creatinine Ratio: 20 (ref 11–26)
BUN: 19 mg/dL (ref 10–36)
CO2: 26 mmol/L (ref 18–29)
CREATININE: 0.93 mg/dL (ref 0.57–1.00)
Calcium: 9.1 mg/dL (ref 8.7–10.3)
Chloride: 108 mmol/L (ref 97–108)
GFR calc Af Amer: 62 mL/min/{1.73_m2} (ref >59)
GFR, EST NON AFRICAN AMERICAN: 54 mL/min/{1.73_m2} — AB (ref >59)
GLUCOSE: 94 mg/dL (ref 65–99)
Potassium: 4.3 mmol/L (ref 3.5–5.2)
Sodium: 148 mmol/L — ABNORMAL HIGH (ref 134–144)

## 2014-02-18 LAB — VITAMIN D 25 HYDROXY (VIT D DEFICIENCY, FRACTURES): VIT D 25 HYDROXY: 57.3 ng/mL (ref 30.0–100.0)

## 2014-02-23 ENCOUNTER — Ambulatory Visit (INDEPENDENT_AMBULATORY_CARE_PROVIDER_SITE_OTHER): Payer: Medicare Other | Admitting: Family Medicine

## 2014-02-23 ENCOUNTER — Ambulatory Visit: Payer: Medicare Other | Admitting: Family Medicine

## 2014-02-23 ENCOUNTER — Encounter: Payer: Self-pay | Admitting: Family Medicine

## 2014-02-23 VITALS — BP 147/67 | HR 68 | Temp 96.8°F | Ht 60.0 in | Wt 114.0 lb

## 2014-02-23 DIAGNOSIS — R413 Other amnesia: Secondary | ICD-10-CM

## 2014-02-23 DIAGNOSIS — E538 Deficiency of other specified B group vitamins: Secondary | ICD-10-CM

## 2014-02-23 DIAGNOSIS — G629 Polyneuropathy, unspecified: Secondary | ICD-10-CM

## 2014-02-23 DIAGNOSIS — I10 Essential (primary) hypertension: Secondary | ICD-10-CM | POA: Diagnosis not present

## 2014-02-23 DIAGNOSIS — E785 Hyperlipidemia, unspecified: Secondary | ICD-10-CM | POA: Diagnosis not present

## 2014-02-23 DIAGNOSIS — Z85850 Personal history of malignant neoplasm of thyroid: Secondary | ICD-10-CM

## 2014-02-23 DIAGNOSIS — E0590 Thyrotoxicosis, unspecified without thyrotoxic crisis or storm: Secondary | ICD-10-CM

## 2014-02-23 DIAGNOSIS — G609 Hereditary and idiopathic neuropathy, unspecified: Secondary | ICD-10-CM | POA: Diagnosis not present

## 2014-02-23 NOTE — Patient Instructions (Addendum)
Medicare Annual Wellness Visit  [facility] and the medical providers at [facility] strive to bring you the best medical care.  In doing so we not only want to address your current medical conditions and concerns but also to detect new conditions early and prevent illness, disease and health-related problems.    Medicare offers a yearly Wellness Visit which allows our clinical staff to assess your need for preventative services including immunizations, lifestyle education, counseling to decrease risk of preventable diseases and screening for fall risk and other medical concerns.    This visit is provided free of charge (no copay) for all Medicare recipients. The clinical pharmacists at [facility] have begun to conduct these Wellness Visits which will also include a thorough review of all your medications.    As you primary medical provider recommend that you make an appointment for your Annual Wellness Visit if you have not done so already this year.  You may set up this appointment before you leave today or you may call back [phone]) and schedule an appointment.  Please make sure when you call that you mention that you are scheduling your Annual Wellness Visit with the clinical pharmacist so that the appointment may be made for the proper length of time.     Continue current medications. Continue good therapeutic lifestyle changes which include good diet and exercise. Fall precautions discussed with patient. If an FOBT was given today- please return it to our front desk. If you are over 43 years old - you may need Prevnar 78 or the adult Pneumonia vaccine.  Flu Shots will be available at our office starting mid- September. Please call and schedule a FLU CLINIC APPOINTMENT.   Drink more water No more climbing. We will call with the results of the B12 level as soon as those results are available We will also check your  thyroid today. Start Centrum silver one daily

## 2014-02-23 NOTE — Progress Notes (Signed)
Subjective:    Patient ID: Bianca Thompson, female    DOB: 11/01/1920, 81 y.o.   MRN: [id number]  HPI Pt here for follow up and management of chronic medical problems. The patient comes to the visit today with her caregiver who is her daughter-in-law and her husband. The biggest complaint is that her gait is unstable. Her memory situation appears to be stable. The patient does complain of decreased appetite and her weight is down 3 pounds from previously.        Patient Active Problem List   Diagnosis Date Noted  . History of thyroid cancer 08/22/2012  . Hyperlipidemia 08/22/2012  . Hypertension 08/22/2012  . Hyperthyroidism 08/22/2012  . Osteoporosis 08/22/2012  . Memory deficit 08/22/2012  . Degenerative arthritis of the lumbar spine 08/22/2012   Outpatient Encounter Prescriptions as of 02/23/2014  Medication Sig  . amLODipine (NORVASC) 5 MG tablet TAKE ONE HALF TABLET BY MOUTH EVERY DAY  . Ascorbic Acid (VITAMIN C) 500 MG tablet Take 500 mg by mouth daily.    Marland Kitchen aspirin (SB LOW DOSE ASA EC) 81 MG EC tablet Take 81 mg by mouth daily.    . calcium-vitamin D (OSCAL WITH D) 500-200 MG-UNIT per tablet Take 1 tablet by mouth 2 (two) times daily.  . Cholecalciferol (VITAMIN D) 2000 UNITS CAPS Take by mouth. Take 2 tablets by mouth Saturday and Sunday . Then on Monday -Friday take one tablet by mouth   . donepezil (ARICEPT) 10 MG tablet TAKE ONE TABLET BY MOUTH ONCE DAILY  . dorzolamide-timolol (COSOPT) 22.3-6.8 MG/ML ophthalmic solution   . ezetimibe-simvastatin (VYTORIN) 10-20 MG per tablet Take 1 tablet by mouth at bedtime.  . Garlic 7681 MG CAPS Take 1,000 capsules by mouth daily.  Marland Kitchen latanoprost (XALATAN) 0.005 % ophthalmic solution   . levothyroxine (SYNTHROID, LEVOTHROID) 112 MCG tablet TAKE ONE TABLET BY MOUTH ONCE DAILY  . Memantine HCl ER (NAMENDA XR) 28 MG CP24 TAKE ONE CAPSULE BY MOUTH ONCE DAILY AT BEDTIME  . Omega-3 Fatty Acids (FISH OIL) 1200 MG CAPS Take 2 capsules by mouth  daily. Take 2-3 tablets by mouth daily  . quinapril (ACCUPRIL) 40 MG tablet TAKE ONE TABLET BY MOUTH ONCE DAILY  . raloxifene (EVISTA) 60 MG tablet TAKE ONE TABLET BY MOUTH ONCE DAILY  . simvastatin (ZOCOR) 20 MG tablet Take 1 tablet (20 mg total) by mouth daily.    Review of Systems  Constitutional: Positive for appetite change (loss).  HENT: Negative.   Eyes: Negative.   Respiratory: Negative.   Cardiovascular: Negative.   Gastrointestinal: Negative.   Endocrine: Negative.   Genitourinary: Negative.   Musculoskeletal: Negative.        Unsteady gait  Skin: Negative.   Allergic/Immunologic: Negative.   Neurological: Negative.   Hematological: Negative.   Psychiatric/Behavioral: Negative.        Memory is stable - per family        Objective:   Physical Exam  Constitutional: She is oriented to person, place, and time. She appears well-developed and well-nourished. No distress.  Elderly and forgetful but appearance is good for her age.  HENT:  Head: Normocephalic and atraumatic.  Right Ear: External ear normal.  Left Ear: External ear normal.  Nose: Nose normal.  Mouth/Throat: Oropharynx is clear and moist.  Eyes: Conjunctivae and EOM are normal. Pupils are equal, round, and reactive to light. Right eye exhibits no discharge. Left eye exhibits no discharge. No scleral icterus.  Neck: Normal range of motion. Neck  supple. No thyromegaly present.  No carotid bruits or anterior cervical adenopathy  Cardiovascular: Normal rate, regular rhythm and normal heart sounds.  Exam reveals no gallop and no friction rub.   No murmur heard. Pedal pulses were difficult to palpate .At 72/m  Pulmonary/Chest: Effort normal and breath sounds normal. No respiratory distress. She has no wheezes. She has no rales. She exhibits no tenderness.  Abdominal: Soft. Bowel sounds are normal. She exhibits no mass. There is no tenderness. There is no rebound and no guarding.  Musculoskeletal: Normal range of  motion. She exhibits no edema or tenderness.  Lymphadenopathy:    She has no cervical adenopathy.  Neurological: She is alert and oriented to person, place, and time. She has normal reflexes. No cranial nerve deficit.  Skin: Skin is warm and dry. No rash noted. No erythema. No pallor.  Psychiatric: She has a normal mood and affect. Her behavior is normal. Thought content normal.  The patient's memory disorder is apparent today and asking her questions and her tummy she can't remember. Fortunately she has her daughter-in-law that is helping them at home with medications etc.  Nursing note and vitals reviewed.  BP 147/67 mmHg  Pulse 68  Temp(Src) 96.8 F (36 C) (Oral)  Ht 5' (1.524 m)  Wt 114 lb (51.71 kg)  BMI 22.26 kg/m2        Assessment & Plan:  1. Essential hypertension  2. Hyperlipidemia  3. Memory deficit -Continue current treatment  4. B12 deficiency - Vitamin B12 - Thyroid Panel With TSH  5. History of thyroid cancer  6. Hyperthyroidism - Vitamin B12 - Thyroid Panel With TSH  7. Peripheral neuropathy - Vitamin B12 - Thyroid Panel With TSH  No orders of the defined types were placed in this encounter.   Patient Instructions                       Medicare Annual Wellness Visit  [facility] and the medical providers at [facility] strive to bring you the best medical care.  In doing so we not only want to address your current medical conditions and concerns but also to detect new conditions early and prevent illness, disease and health-related problems.    Medicare offers a yearly Wellness Visit which allows our clinical staff to assess your need for preventative services including immunizations, lifestyle education, counseling to decrease risk of preventable diseases and screening for fall risk and other medical concerns.    This visit is provided free of charge (no copay) for all Medicare recipients. The clinical pharmacists at [facility] have begun to conduct these Wellness Visits which will also include a thorough review of all your medications.    As you primary medical provider recommend that you make an appointment for your Annual Wellness Visit if you have not done so already this year.  You may set up this appointment before you leave today or you may call back [phone]) and schedule an appointment.  Please make sure when you call that you mention that you are scheduling your Annual Wellness Visit with the clinical pharmacist so that the appointment may be made for the proper length of time.     Continue current medications. Continue good therapeutic lifestyle changes which include good diet and exercise. Fall precautions discussed with patient. If an FOBT was given today- please return it to our front desk. If you are over 74 years old - you may  need Prevnar 13 or the adult Pneumonia vaccine.  Flu Shots will be available at our office starting mid- September. Please call and schedule a FLU CLINIC APPOINTMENT.   Drink more water No more climbing. We will call with the results of the B12 level as soon as those results are available We will also check your thyroid today. Start Centrum silver one daily   Arrie Senate MD

## 2014-02-24 LAB — THYROID PANEL WITH TSH
FREE THYROXINE INDEX: 5.1 — AB (ref 1.2–4.9)
T3 Uptake Ratio: 33 % (ref 24–39)
T4, Total: 15.4 ug/dL — ABNORMAL HIGH (ref 4.5–12.0)
TSH: 0.008 u[IU]/mL — ABNORMAL LOW (ref 0.450–4.500)

## 2014-02-24 LAB — VITAMIN B12: VITAMIN B 12: 331 pg/mL (ref 211–946)

## 2014-06-08 DIAGNOSIS — H4011X2 Primary open-angle glaucoma, moderate stage: Secondary | ICD-10-CM | POA: Diagnosis not present

## 2014-06-08 DIAGNOSIS — H4011X3 Primary open-angle glaucoma, severe stage: Secondary | ICD-10-CM | POA: Diagnosis not present

## 2014-06-09 ENCOUNTER — Encounter: Payer: Self-pay | Admitting: Family Medicine

## 2014-06-10 ENCOUNTER — Encounter: Payer: Self-pay | Admitting: Family Medicine

## 2014-07-06 ENCOUNTER — Ambulatory Visit: Payer: Medicare Other | Admitting: Family Medicine

## 2014-07-08 ENCOUNTER — Encounter: Payer: Self-pay | Admitting: Family Medicine

## 2014-07-08 ENCOUNTER — Ambulatory Visit (INDEPENDENT_AMBULATORY_CARE_PROVIDER_SITE_OTHER): Payer: Medicare Other | Admitting: Family Medicine

## 2014-07-08 VITALS — BP 143/72 | HR 66 | Temp 97.0°F | Ht 60.0 in | Wt 115.0 lb

## 2014-07-08 DIAGNOSIS — E785 Hyperlipidemia, unspecified: Secondary | ICD-10-CM

## 2014-07-08 DIAGNOSIS — R413 Other amnesia: Secondary | ICD-10-CM

## 2014-07-08 DIAGNOSIS — Z85850 Personal history of malignant neoplasm of thyroid: Secondary | ICD-10-CM | POA: Diagnosis not present

## 2014-07-08 DIAGNOSIS — D692 Other nonthrombocytopenic purpura: Secondary | ICD-10-CM | POA: Diagnosis not present

## 2014-07-08 DIAGNOSIS — H9190 Unspecified hearing loss, unspecified ear: Secondary | ICD-10-CM

## 2014-07-08 DIAGNOSIS — F32A Depression, unspecified: Secondary | ICD-10-CM

## 2014-07-08 DIAGNOSIS — F329 Major depressive disorder, single episode, unspecified: Secondary | ICD-10-CM

## 2014-07-08 DIAGNOSIS — E559 Vitamin D deficiency, unspecified: Secondary | ICD-10-CM | POA: Diagnosis not present

## 2014-07-08 DIAGNOSIS — K5900 Constipation, unspecified: Secondary | ICD-10-CM

## 2014-07-08 DIAGNOSIS — I10 Essential (primary) hypertension: Secondary | ICD-10-CM

## 2014-07-08 DIAGNOSIS — M810 Age-related osteoporosis without current pathological fracture: Secondary | ICD-10-CM | POA: Diagnosis not present

## 2014-07-08 LAB — POCT CBC
Granulocyte percent: 69.8 %G (ref 37–80)
HEMATOCRIT: 41.9 % (ref 37.7–47.9)
Hemoglobin: 13.2 g/dL (ref 12.2–16.2)
LYMPH, POC: 1.8 (ref 0.6–3.4)
MCH, POC: 28.2 pg (ref 27–31.2)
MCHC: 31.6 g/dL — AB (ref 31.8–35.4)
MCV: 89.4 fL (ref 80–97)
MPV: 8.7 fL (ref 0–99.8)
PLATELET COUNT, POC: 198 10*3/uL (ref 142–424)
POC GRANULOCYTE: 5 (ref 2–6.9)
POC LYMPH PERCENT: 25.2 %L (ref 10–50)
RBC: 4.68 M/uL (ref 4.04–5.48)
RDW, POC: 13.9 %
WBC: 7.1 10*3/uL (ref 4.6–10.2)

## 2014-07-08 NOTE — Patient Instructions (Addendum)
Medicare Annual Wellness Visit  [facility] and the medical providers at [facility] strive to bring you the best medical care.  In doing so we not only want to address your current medical conditions and concerns but also to detect new conditions early and prevent illness, disease and health-related problems.    Medicare offers a yearly Wellness Visit which allows our clinical staff to assess your need for preventative services including immunizations, lifestyle education, counseling to decrease risk of preventable diseases and screening for fall risk and other medical concerns.    This visit is provided free of charge (no copay) for all Medicare recipients. The clinical pharmacists at [facility] have begun to conduct these Wellness Visits which will also include a thorough review of all your medications.    As you primary medical provider recommend that you make an appointment for your Annual Wellness Visit if you have not done so already this year.  You may set up this appointment before you leave today or you may call back ([phone]) and schedule an appointment.  Please make sure when you call that you mention that you are scheduling your Annual Wellness Visit with the clinical pharmacist so that the appointment may be made for the proper length of time.     Continue current medications. Continue good therapeutic lifestyle changes which include good diet and exercise. Fall precautions discussed with patient. If an FOBT was given today- please return it to our front desk. If you are over 89 years old - you may need Prevnar 92 or the adult Pneumonia vaccine.  Flu Shots are still available at our office. If you still haven't had one please call to set up a nurse visit to get one.   After your visit with [place] today you will receive a survey in the mail or online from [employer] regarding your care with [place]. Please take a moment to  fill this out. Your feedback is very important to [place] as you can help [place] better understand your patient needs as well as improve your experience and satisfaction. WE CARE ABOUT YOU!!!   The patient should use her lifeline if needed She should stay as active as possible She should continue the stool softener and use the MiraLAX to 3 times weekly in addition to the stool softener She needs to drink plenty of water and fluids She should continue to be careful not put herself at risk for falling and use nightlights in the house at nighttime

## 2014-07-08 NOTE — Progress Notes (Signed)
Subjective:    Patient ID: Bianca Thompson, female    DOB: 15-Jun-1920, 81 y.o.   MRN: [id number]  HPI Pt here for follow up and management of chronic medical problems which includes hypertension, hyperlipidemia, and thyroid disease. She is accompanied today by her daughter in law. She is taking medications regularly. Since I saw the patient the last time she has lost her husband from a stroke. She is lucky because she has her son and daughter-in-law nearby and they monitor her regularly. She remains somewhat confused and has increased anxiety and nervousness.      Patient Active Problem List   Diagnosis Date Noted  . History of thyroid cancer 08/22/2012  . Hyperlipidemia 08/22/2012  . Hypertension 08/22/2012  . Hyperthyroidism 08/22/2012  . Osteoporosis 08/22/2012  . Memory deficit 08/22/2012  . Degenerative arthritis of the lumbar spine 08/22/2012   Outpatient Encounter Prescriptions as of 07/08/2014  Medication Sig  . amLODipine (NORVASC) 5 MG tablet TAKE ONE HALF TABLET BY MOUTH EVERY DAY  . Ascorbic Acid (VITAMIN C) 500 MG tablet Take 500 mg by mouth daily.    Marland Kitchen aspirin (SB LOW DOSE ASA EC) 81 MG EC tablet Take 81 mg by mouth daily.    . calcium-vitamin D (OSCAL WITH D) 500-200 MG-UNIT per tablet Take 1 tablet by mouth 2 (two) times daily.  . Cholecalciferol (VITAMIN D) 2000 UNITS CAPS Take by mouth. Take 2 tablets by mouth Saturday and Sunday . Then on Monday -Friday take one tablet by mouth   . docusate sodium (COLACE) 100 MG capsule Take 100 mg by mouth daily.  Marland Kitchen donepezil (ARICEPT) 10 MG tablet TAKE ONE TABLET BY MOUTH ONCE DAILY  . dorzolamide-timolol (COSOPT) 22.3-6.8 MG/ML ophthalmic solution   . Garlic 3716 MG CAPS Take 1,000 capsules by mouth daily.  Marland Kitchen latanoprost (XALATAN) 0.005 % ophthalmic solution   . levothyroxine (SYNTHROID, LEVOTHROID) 112 MCG tablet TAKE ONE TABLET BY MOUTH ONCE DAILY  . Memantine HCl ER (NAMENDA XR) 28 MG CP24 TAKE ONE CAPSULE BY MOUTH ONCE DAILY  AT BEDTIME  . Omega-3 Fatty Acids (FISH OIL) 1200 MG CAPS Take 2 capsules by mouth daily. Take 2-3 tablets by mouth daily  . quinapril (ACCUPRIL) 40 MG tablet TAKE ONE TABLET BY MOUTH ONCE DAILY  . raloxifene (EVISTA) 60 MG tablet TAKE ONE TABLET BY MOUTH ONCE DAILY   Facility-Administered Encounter Medications as of 07/08/2014  Medication  . cyanocobalamin ((VITAMIN B-12)) injection 1,000 mcg      Review of Systems  Constitutional: Negative.   HENT: Negative.   Eyes: Negative.   Respiratory: Negative.   Cardiovascular: Negative.   Gastrointestinal: Negative.   Endocrine: Negative.   Genitourinary: Negative.   Musculoskeletal: Negative.   Skin: Negative.   Allergic/Immunologic: Negative.   Neurological: Negative.   Hematological: Negative.   Psychiatric/Behavioral: Positive for confusion. The patient is nervous/anxious.        Objective:   Physical Exam  Constitutional: She is oriented to person, place, and time. She appears well-developed and well-nourished. She appears distressed.  Patient physically looks good for her age and she is alert with some memory disturbance. This does not appear to be a lot worse than previously. She is mourning the loss of her husband is somewhat tearful during the visit today but also has some smiles today  HENT:  Head: Normocephalic and atraumatic.  Right Ear: External ear normal.  Left Ear: External ear normal.  Nose: Nose normal.  Mouth/Throat: Oropharynx is clear and moist.  No oropharyngeal exudate.  Eyes: Conjunctivae and EOM are normal. Pupils are equal, round, and reactive to light. Right eye exhibits no discharge. Left eye exhibits no discharge. No scleral icterus.  Neck: Normal range of motion. Neck supple. No thyromegaly present.  Cardiovascular: Normal rate, regular rhythm, normal heart sounds and intact distal pulses.  Exam reveals no gallop and no friction rub.   No murmur heard. The heart had a regular rate and rhythm    Pulmonary/Chest: Effort normal and breath sounds normal. No respiratory distress. She has no wheezes. She has no rales. She exhibits no tenderness.  Abdominal: Soft. Bowel sounds are normal. She exhibits no mass. There is no tenderness. There is no rebound and no guarding.  No bruits  Musculoskeletal: Normal range of motion. She exhibits no edema or tenderness.  Somewhat hesitant range of motion but walks without assistance  Lymphadenopathy:    She has no cervical adenopathy.  Neurological: She is alert and oriented to person, place, and time. She has normal reflexes. No cranial nerve deficit.  Skin: Skin is warm and dry. No rash noted.  She does have some scattered bruises on both arms.  Psychiatric: She has a normal mood and affect. Her behavior is normal. Judgment and thought content normal.  Nursing note and vitals reviewed.  BP 143/72 mmHg  Pulse 66  Temp(Src) 97 F (36.1 C) (Oral)  Ht 5' (1.524 m)  Wt 115 lb (52.164 kg)  BMI 22.46 kg/m2  Results for orders placed or performed in visit on 07/08/14  POCT CBC  Result Value Ref Range   WBC 7.1 4.6 - 10.2 K/uL   Lymph, poc 1.8 0.6 - 3.4   POC LYMPH PERCENT 25.2 10 - 50 %L   POC Granulocyte 5.0 2 - 6.9   Granulocyte percent 69.8 37 - 80 %G   RBC 4.68 4.04 - 5.48 M/uL   Hemoglobin 13.2 12.2 - 16.2 g/dL   HCT, POC 41.9 37.7 - 47.9 %   MCV 89.4 80 - 97 fL   MCH, POC 28.2 27 - 31.2 pg   MCHC 31.6 (A) 31.8 - 35.4 g/dL   RDW, POC 13.9 %   Platelet Count, POC 198 142 - 424 K/uL   MPV 8.7 0 - 99.8 fL         Assessment & Plan:  1. Essential hypertension -The blood pressure is slightly elevated today but there will be no change in treatment as the patient was somewhat upset and she will continue to monitor this at home - POCT CBC - BMP8+EGFR - Lipid panel  2. Hyperlipidemia -She will continue with her current treatment of omega-3 fatty acids and garlic and aggressive therapeutic lifestyle changes which include diet and  exercise - POCT CBC - Hepatic function panel  3. Memory deficit -The patient appears stable as far as this is concerned and will continue with her Namenda and Aricept - POCT CBC  4. Osteoporosis, post-menopausal -She should continue with her calcium and vitamin D supplement. - POCT CBC - Vit D  25 hydroxy (rtn osteoporosis monitoring)  5. History of thyroid cancer -This is been years ago and there were no thyroid masses or lumps felt today. - POCT CBC - Thyroid Panel With TSH  6. Vitamin D deficiency -She will continue with her current vitamin D supplement pending results of lab work - Vit D  25 hydroxy (rtn osteoporosis monitoring)  7. Melancholy -She was encouraged to get out and be more active and be thankful  for all the friends and family she hasn't she understands this and agrees with this and I think with time this will improve  8. Senile purpura -She has had no falls but does have bruises on both arms.  9. Constipation, unspecified constipation type -She is currently doing a stool softener in her pillbox and her daughter-in-law will see that she try some MiraLAX 2 or 3 times weekly to see if this can help with the constipation as well as encouraging her to drink more water  Patient Instructions                       Medicare Annual Wellness Visit  [facility] and the medical providers at [facility] strive to bring you the best medical care.  In doing so we not only want to address your current medical conditions and concerns but also to detect new conditions early and prevent illness, disease and health-related problems.    Medicare offers a yearly Wellness Visit which allows our clinical staff to assess your need for preventative services including immunizations, lifestyle education, counseling to decrease risk of preventable diseases and screening for fall risk and other medical concerns.    This visit is provided free of charge (no copay) for  all Medicare recipients. The clinical pharmacists at [facility] have begun to conduct these Wellness Visits which will also include a thorough review of all your medications.    As you primary medical provider recommend that you make an appointment for your Annual Wellness Visit if you have not done so already this year.  You may set up this appointment before you leave today or you may call back ([phone]) and schedule an appointment.  Please make sure when you call that you mention that you are scheduling your Annual Wellness Visit with the clinical pharmacist so that the appointment may be made for the proper length of time.     Continue current medications. Continue good therapeutic lifestyle changes which include good diet and exercise. Fall precautions discussed with patient. If an FOBT was given today- please return it to our front desk. If you are over 67 years old - you may need Prevnar 56 or the adult Pneumonia vaccine.  Flu Shots are still available at our office. If you still haven't had one please call to set up a nurse visit to get one.   After your visit with [place] today you will receive a survey in the mail or online from [employer] regarding your care with [place]. Please take a moment to fill this out. Your feedback is very important to [place] as you can help [place] better understand your patient needs as well as improve your experience and satisfaction. WE CARE ABOUT YOU!!!   The patient should use her lifeline if needed She should stay as active as possible She should continue the stool softener and use the MiraLAX to 3 times weekly in addition to the stool softener She needs to drink plenty of water and fluids She should continue to be careful not put herself at risk for falling and use nightlights in the house at nighttime   Arrie Senate MD

## 2014-07-09 ENCOUNTER — Telehealth: Payer: Self-pay | Admitting: *Deleted

## 2014-07-09 LAB — BMP8+EGFR
BUN/Creatinine Ratio: 22 (ref 11–26)
BUN: 21 mg/dL (ref 10–36)
CALCIUM: 10 mg/dL (ref 8.7–10.3)
CO2: 26 mmol/L (ref 18–29)
CREATININE: 0.95 mg/dL (ref 0.57–1.00)
Chloride: 105 mmol/L (ref 97–108)
GFR calc Af Amer: 60 mL/min/{1.73_m2} (ref >59)
GFR calc non Af Amer: 52 mL/min/{1.73_m2} — ABNORMAL LOW (ref >59)
GLUCOSE: 84 mg/dL (ref 65–99)
Potassium: 4.8 mmol/L (ref 3.5–5.2)
SODIUM: 145 mmol/L — AB (ref 134–144)

## 2014-07-09 LAB — HEPATIC FUNCTION PANEL
ALBUMIN: 4.1 g/dL (ref 3.2–4.6)
ALT: 9 IU/L (ref 0–32)
AST: 16 IU/L (ref 0–40)
Alkaline Phosphatase: 38 IU/L — ABNORMAL LOW (ref 39–117)
Bilirubin Total: 0.2 mg/dL (ref 0.0–1.2)
Bilirubin, Direct: 0.09 mg/dL (ref 0.00–0.40)
Total Protein: 6.5 g/dL (ref 6.0–8.5)

## 2014-07-09 LAB — THYROID PANEL WITH TSH
FREE THYROXINE INDEX: 4.1 (ref 1.2–4.9)
T3 Uptake Ratio: 30 % (ref 24–39)
T4, Total: 13.8 ug/dL — ABNORMAL HIGH (ref 4.5–12.0)
TSH: 0.009 u[IU]/mL — AB (ref 0.450–4.500)

## 2014-07-09 LAB — LIPID PANEL
CHOL/HDL RATIO: 2.2 ratio (ref 0.0–4.4)
Cholesterol, Total: 183 mg/dL (ref 100–199)
HDL: 83 mg/dL (ref >39)
LDL CALC: 79 mg/dL (ref 0–99)
TRIGLYCERIDES: 104 mg/dL (ref 0–149)
VLDL Cholesterol Cal: 21 mg/dL (ref 5–40)

## 2014-07-09 LAB — VITAMIN D 25 HYDROXY (VIT D DEFICIENCY, FRACTURES): Vit D, 25-Hydroxy: 65.1 ng/mL (ref 30.0–100.0)

## 2014-07-09 NOTE — Telephone Encounter (Signed)
Pt's daughter in law notified of results Verbalizes understanding She also stated pt wants to see podiatrist Dorian Pod (daughter-in-law) will schedule appt and will call back if referral needed

## 2014-07-09 NOTE — Telephone Encounter (Signed)
-----   Message from Chipper Herb, MD sent at 07/09/2014  7:47 AM EDT ----- Please [name], her daughter-in-law with all the results of this lab work The blood sugar is good at 84. The creatinine, the most important kidney function test is within normal limits. The electrolytes including potassium are good except the serum sodium is minimally elevated and this is been the case in the past. All cholesterol numbers with traditional lipid testing are excellent and at goal and the patient should continue with aggressive therapeutic lifestyle changes which include diet and exercise as much as possible All liver function tests are within normal limits except the alkaline phosphatase is low and this is also consistent with past readings. Thyroid function tests are consistent with having a history of thyroid cancer with the TSH being low and she should continue with current treatment The vitamin D level is excellent and she should continue with current treatment

## 2014-08-17 ENCOUNTER — Telehealth: Payer: Self-pay | Admitting: Family Medicine

## 2014-08-17 ENCOUNTER — Other Ambulatory Visit: Payer: Self-pay

## 2014-08-17 ENCOUNTER — Telehealth: Payer: Self-pay

## 2014-08-17 DIAGNOSIS — H9190 Unspecified hearing loss, unspecified ear: Secondary | ICD-10-CM

## 2014-08-17 NOTE — Telephone Encounter (Signed)
Please refer to Dr. Romeo Rabon next door

## 2014-08-17 NOTE — Telephone Encounter (Signed)
Wants a referral for hearing dr.

## 2014-08-18 ENCOUNTER — Ambulatory Visit: Payer: Medicare Other | Attending: Audiology | Admitting: Audiology

## 2014-08-18 DIAGNOSIS — H903 Sensorineural hearing loss, bilateral: Secondary | ICD-10-CM | POA: Insufficient documentation

## 2014-08-18 DIAGNOSIS — H93293 Other abnormal auditory perceptions, bilateral: Secondary | ICD-10-CM

## 2014-08-18 DIAGNOSIS — R292 Abnormal reflex: Secondary | ICD-10-CM | POA: Diagnosis not present

## 2014-08-18 NOTE — Progress Notes (Signed)
[facility] [address] [place], [place] [address] [place] EVALUATION  Name: Bianca Thompson DOB:  [birth year] MRN:  [id number]                                Diagnosis: hearing loss Date: 08/18/2014    Referent: Redge Gainer, MD  HISTORY: Bianca Thompson, age 81 y.o. years, was seen for an audiological evaluation.  Her daughter in law accompanied her.  Bianca Thompson states that she has difficulty hearing. She "can't hear at church even when sitting in the front row".  The family wanted to see Dr. Romeo Rabon for hearing aids but an appointment was set up here and they decided to go ahead with the hearing evaluation.  Bianca Thompson notices a "hearing loss over the past year".  Bianca Thompson sometimes "feels like she is going to fall" and "wanders when she is walking".  She denies vertigo.  Bianca Thompson has been using a cane about two years. Marland Kitchen         EVALUATION: Pure tone air and tone conduction was completed using conventional audiometry with inserts. Hearing thresholds are 35-40 dBHL at '250Hz' ; 45-50 dBHL at '500Hz' ; 45-55 dBHL at '1000Hz' ; 50-60 dBHL from '2000Hz'  - '4000Hz'  and 65-70 dBHL at '8000Hz'  in each ear. Speech detection thresholds are 45 dBHL in each ear using multitalker noise.  The reliability is good. Word recognition is 46% at 90dBHL in the right and 50% at 90dBHL in the left using recorded NU-6 word lists in quiet.  When presented to both ears at the same time her word recognition improves to 84% at 90 dBHL. Otoscopic inspection reveals clear ear canals with visible tympanic membranes.   Tympanometry showed normal volume, middle ear pressure and compliance in each ear (Type A). Ipsilateral acoustic reflexes were absent bilaterally.  CONCLUSION:      Bianca Thompson has mild sloping to moderately severe high frequency sensorineural hearing loss bilaterally She has poor word recognition in quiet at very loud levels (equivalent to shouting at a  distance of 1-2 feet) in each ear. However, with binaural hearing (sounds presented to both ears at the same time) her word recognition increased to 84% correct.  Therefore binaural amplification is strongly recommended.   RECOMMENDATIONS: 1.   A hearing aid evaluation at [facility] as requested by family and supported by today's test results.  2.   [employer] in [place] may help with obtaining one hearing aid or one captioned telephone if hearing loss and financial qualifications are met.  Please contact Rex Kras at [phone] . List of Providers for [employer] [facility] August 21, 2011 - August 19, 2013. The following hearing aid companies have contracted with [facility] to fit hearing aids for eligible applicants. Representatives of these companies are not [facility] employees. Inclusion on this list means the company agrees to the terms of contract pricing and inclusion is not an endorsement of their services over those who are not contracted with the division. An applicant may choose any provider listed from your region to assist in obtaining hearing aids through this service. If problems arise during this process, please contact the [place] that provided the application.  [facility] Vendor List 1 Updated; June 17, 2012  [employer] [phone]  3.   A balance assessment from physical  therapy.  Deborah L. Heide Spark, Au.D., CCC-A Doctor of Audiology  08/18/2014  cc: Redge Gainer, MD

## 2014-08-20 DIAGNOSIS — Z1231 Encounter for screening mammogram for malignant neoplasm of breast: Secondary | ICD-10-CM | POA: Diagnosis not present

## 2014-08-24 ENCOUNTER — Encounter: Payer: Self-pay | Admitting: Family Medicine

## 2014-08-27 DIAGNOSIS — R922 Inconclusive mammogram: Secondary | ICD-10-CM | POA: Diagnosis not present

## 2014-09-03 ENCOUNTER — Other Ambulatory Visit: Payer: Self-pay | Admitting: Family Medicine

## 2014-09-07 ENCOUNTER — Encounter: Payer: Self-pay | Admitting: Family Medicine

## 2014-09-10 ENCOUNTER — Encounter: Payer: Self-pay | Admitting: *Deleted

## 2014-09-16 ENCOUNTER — Telehealth: Payer: Self-pay | Admitting: Family Medicine

## 2014-10-06 ENCOUNTER — Encounter: Payer: Self-pay | Admitting: Family Medicine

## 2014-10-14 ENCOUNTER — Other Ambulatory Visit: Payer: Self-pay | Admitting: *Deleted

## 2014-10-14 ENCOUNTER — Telehealth: Payer: Self-pay | Admitting: Family Medicine

## 2014-10-14 MED ORDER — LEVOTHYROXINE SODIUM 112 MCG PO TABS: ORAL_TABLET | ORAL | Status: DC

## 2014-11-22 ENCOUNTER — Other Ambulatory Visit: Payer: Self-pay | Admitting: Family Medicine

## 2014-11-23 DIAGNOSIS — H401113 Primary open-angle glaucoma, right eye, severe stage: Secondary | ICD-10-CM | POA: Diagnosis not present

## 2014-11-23 DIAGNOSIS — H401122 Primary open-angle glaucoma, left eye, moderate stage: Secondary | ICD-10-CM | POA: Diagnosis not present

## 2014-11-25 ENCOUNTER — Ambulatory Visit (INDEPENDENT_AMBULATORY_CARE_PROVIDER_SITE_OTHER): Payer: Medicare Other | Admitting: Family Medicine

## 2014-11-25 ENCOUNTER — Encounter: Payer: Self-pay | Admitting: Family Medicine

## 2014-11-25 VITALS — BP 145/65 | HR 58 | Temp 97.0°F | Ht 60.0 in | Wt 116.0 lb

## 2014-11-25 DIAGNOSIS — Z23 Encounter for immunization: Secondary | ICD-10-CM | POA: Diagnosis not present

## 2014-11-25 DIAGNOSIS — R413 Other amnesia: Secondary | ICD-10-CM | POA: Diagnosis not present

## 2014-11-25 DIAGNOSIS — E559 Vitamin D deficiency, unspecified: Secondary | ICD-10-CM | POA: Diagnosis not present

## 2014-11-25 DIAGNOSIS — D692 Other nonthrombocytopenic purpura: Secondary | ICD-10-CM

## 2014-11-25 DIAGNOSIS — E785 Hyperlipidemia, unspecified: Secondary | ICD-10-CM | POA: Diagnosis not present

## 2014-11-25 DIAGNOSIS — J3489 Other specified disorders of nose and nasal sinuses: Secondary | ICD-10-CM

## 2014-11-25 DIAGNOSIS — I10 Essential (primary) hypertension: Secondary | ICD-10-CM | POA: Diagnosis not present

## 2014-11-25 DIAGNOSIS — E538 Deficiency of other specified B group vitamins: Secondary | ICD-10-CM | POA: Diagnosis not present

## 2014-11-25 DIAGNOSIS — Z85850 Personal history of malignant neoplasm of thyroid: Secondary | ICD-10-CM

## 2014-11-25 NOTE — Patient Instructions (Addendum)
Medicare Annual Wellness Visit  [facility] and the medical providers at [facility] strive to bring you the best medical care.  In doing so we not only want to address your current medical conditions and concerns but also to detect new conditions early and prevent illness, disease and health-related problems.    Medicare offers a yearly Wellness Visit which allows our clinical staff to assess your need for preventative services including immunizations, lifestyle education, counseling to decrease risk of preventable diseases and screening for fall risk and other medical concerns.    This visit is provided free of charge (no copay) for all Medicare recipients. The clinical pharmacists at [facility] have begun to conduct these Wellness Visits which will also include a thorough review of all your medications.    As you primary medical provider recommend that you make an appointment for your Annual Wellness Visit if you have not done so already this year.  You may set up this appointment before you leave today or you may call back ([phone]) and schedule an appointment.  Please make sure when you call that you mention that you are scheduling your Annual Wellness Visit with the clinical pharmacist so that the appointment may be made for the proper length of time.     Continue current medications. Continue good therapeutic lifestyle changes which include good diet and exercise. Fall precautions discussed with patient. If an FOBT was given today- please return it to our front desk. If you are over 31 years old - you may need Prevnar 61 or the adult Pneumonia vaccine.  **Flu shots will be available soon--- please call and schedule a FLU-CLINIC appointment**  After your visit with [place] today you will receive a survey in the mail or online from [employer] regarding your care with [place]. Please take a moment to fill this out. Your feedback is  very important to [place] as you can help [place] better understand your patient needs as well as improve your experience and satisfaction. WE CARE ABOUT YOU!!!    The patient should be encouraged to continue to be active and be with people and she also should be continued to give time to continue to mourn We will check about the Atrovent nasal spray for the constant runny nose and we will let [name] no when we make sure there is no drug interactions This winter keep the house as cool as possible and use nasal saline and Mucinex if needed for cough and congestion Drink plenty of fluids

## 2014-11-25 NOTE — Progress Notes (Signed)
Subjective:    Patient ID: Bianca Thompson, female    DOB: 01/07/1921, 81 y.o.   MRN: [id number]  HPI Pt here for follow up and management of chronic medical problems which includes hypertension, hyperlipidemia, and hyperthyroid. She is taking medications regularly. She is accompanied today by [name], her daughter-in-law. The patient remains upset from losing her husband. The caregiver for her daughter-in-law indicates that Sheyanne has been doing well except for the grieving process. She has not had any chest pain shortness of breath trouble swallowing heartburn indigestion nausea vomiting or diarrhea. She does wear depends. She does not drink a lot of fluids. We had a long discussion about the grieving process and she was still crying during that discussion.      Patient Active Problem List   Diagnosis Date Noted  . Senile purpura ([place]) 07/08/2014  . History of thyroid cancer 08/22/2012  . Hyperlipidemia 08/22/2012  . Hypertension 08/22/2012  . Hyperthyroidism 08/22/2012  . Osteoporosis 08/22/2012  . Memory deficit 08/22/2012  . Degenerative arthritis of the lumbar spine 08/22/2012   Outpatient Encounter Prescriptions as of 11/25/2014  Medication Sig  . amLODipine (NORVASC) 5 MG tablet TAKE ONE HALF TABLET BY MOUTH EVERY DAY  . Ascorbic Acid (VITAMIN C) 500 MG tablet Take 500 mg by mouth daily.    Marland Kitchen aspirin (SB LOW DOSE ASA EC) 81 MG EC tablet Take 81 mg by mouth daily.    . calcium-vitamin D (OSCAL WITH D) 500-200 MG-UNIT per tablet Take 1 tablet by mouth 2 (two) times daily.  . Cholecalciferol (VITAMIN D) 2000 UNITS CAPS Take by mouth. Take 2 tablets by mouth Saturday and Sunday . Then on Monday -Friday take one tablet by mouth   . docusate sodium (COLACE) 100 MG capsule Take 100 mg by mouth daily.  Marland Kitchen donepezil (ARICEPT) 10 MG tablet TAKE ONE TABLET BY MOUTH ONCE DAILY  . dorzolamide-timolol (COSOPT) 22.3-6.8 MG/ML ophthalmic solution   . Garlic 1950 MG CAPS Take 1,000 capsules by mouth  daily.  Marland Kitchen latanoprost (XALATAN) 0.005 % ophthalmic solution   . levothyroxine (SYNTHROID, LEVOTHROID) 112 MCG tablet TAKE ONE TABLET BY MOUTH ONCE DAILY  . Memantine HCl ER (NAMENDA XR) 28 MG CP24 TAKE ONE CAPSULE BY MOUTH ONCE DAILY AT BEDTIME  . Omega-3 Fatty Acids (FISH OIL) 1200 MG CAPS Take 2 capsules by mouth daily. Take 2-3 tablets by mouth daily  . quinapril (ACCUPRIL) 40 MG tablet TAKE ONE TABLET BY MOUTH ONCE DAILY  . raloxifene (EVISTA) 60 MG tablet TAKE ONE TABLET BY MOUTH ONCE DAILY   Facility-Administered Encounter Medications as of 11/25/2014  Medication  . cyanocobalamin ((VITAMIN B-12)) injection 1,000 mcg      Review of Systems  Constitutional: Negative.   HENT: Negative.   Eyes: Negative.   Respiratory: Negative.   Cardiovascular: Negative.   Gastrointestinal: Negative.   Endocrine: Negative.   Genitourinary: Negative.   Musculoskeletal: Negative.   Skin: Negative.   Allergic/Immunologic: Negative.   Neurological: Negative.   Hematological: Negative.   Psychiatric/Behavioral: Negative.        Objective:   Physical Exam  Constitutional: She is oriented to person, place, and time. She appears well-developed and well-nourished. She appears distressed.  The patient looks extremely good for her age of 33 years although she still tearful and crying.  HENT:  Head: Normocephalic and atraumatic.  Right Ear: External ear normal.  Left Ear: External ear normal.  Nose: Nose normal.  Mouth/Throat: Oropharynx is clear and moist. No oropharyngeal  exudate.  Eyes: Conjunctivae and EOM are normal. Pupils are equal, round, and reactive to light. Right eye exhibits no discharge. Left eye exhibits no discharge. No scleral icterus.  Neck: Normal range of motion. Neck supple. No thyromegaly present.  Cardiovascular: Normal rate, regular rhythm and normal heart sounds.   No murmur heard. Pulmonary/Chest: Effort normal and breath sounds normal. No respiratory distress. She has  no wheezes. She has no rales. She exhibits no tenderness.  Clear anteriorly and posteriorly  Abdominal: Soft. Bowel sounds are normal. She exhibits no mass. There is no tenderness. There is no rebound and no guarding.  No abdominal tenderness or masses  Musculoskeletal: Normal range of motion. She exhibits no edema or tenderness.  Lymphadenopathy:    She has no cervical adenopathy.  Neurological: She is alert and oriented to person, place, and time. She has normal reflexes. No cranial nerve deficit.  Skin: Skin is warm and dry. No rash noted.  Psychiatric: She has a normal mood and affect. Her behavior is normal. Judgment and thought content normal.  Stat and depressed  Nursing note and vitals reviewed.  BP 145/65 mmHg  Pulse 58  Temp(Src) 97 F (36.1 C) (Oral)  Ht 5' (1.524 m)  Wt 116 lb (52.617 kg)  BMI 22.65 kg/m2        Assessment & Plan:  1. Hyperlipidemia -Continue current treatment pending results of lab work - CBC with Differential/Platelet - Lipid panel  2. Essential hypertension -The blood pressure is slightly elevated today but there will be no change in treatment - BMP8+EGFR - Hepatic function panel - CBC with Differential/Platelet  3. Memory deficit -Other than the sadness and the crying the patient's memory appears to be stable and her daughter-in-law agrees with this. - CBC with Differential/Platelet  4. Vitamin D deficiency -Continue current vitamin D replacement pending results of lab work - CBC with Differential/Platelet - Vit D  25 hydroxy (rtn osteoporosis monitoring)  5. History of thyroid cancer -We will check a thyroid today and make sure that she remains hyper thyroid with her medication treatment as she has been in the past because of the thyroid cancer - CBC with Differential/Platelet  6. B12 deficiency - CBC with Differential/Platelet  7. Encounter for immunization -Flu shot given today  8. Senile purpura ([facility]) -No additional  bruising is noted  9. Rhinorrhea -We will consider Atrovent nasal spray after discussing with pharmacy  Patient Instructions                       Medicare Annual Wellness Visit  [facility] and the medical providers at [facility] strive to bring you the best medical care.  In doing so we not only want to address your current medical conditions and concerns but also to detect new conditions early and prevent illness, disease and health-related problems.    Medicare offers a yearly Wellness Visit which allows our clinical staff to assess your need for preventative services including immunizations, lifestyle education, counseling to decrease risk of preventable diseases and screening for fall risk and other medical concerns.    This visit is provided free of charge (no copay) for all Medicare recipients. The clinical pharmacists at [facility] have begun to conduct these Wellness Visits which will also include a thorough review of all your medications.    As you primary medical provider recommend that you make an appointment for your Annual Wellness Visit if you have not done  so already this year.  You may set up this appointment before you leave today or you may call back ([phone]) and schedule an appointment.  Please make sure when you call that you mention that you are scheduling your Annual Wellness Visit with the clinical pharmacist so that the appointment may be made for the proper length of time.     Continue current medications. Continue good therapeutic lifestyle changes which include good diet and exercise. Fall precautions discussed with patient. If an FOBT was given today- please return it to our front desk. If you are over 34 years old - you may need Prevnar 21 or the adult Pneumonia vaccine.  **Flu shots will be available soon--- please call and schedule a FLU-CLINIC appointment**  After your visit with [place] today you will receive a  survey in the mail or online from [employer] regarding your care with [place]. Please take a moment to fill this out. Your feedback is very important to [place] as you can help [place] better understand your patient needs as well as improve your experience and satisfaction. WE CARE ABOUT YOU!!!    The patient should be encouraged to continue to be active and be with people and she also should be continued to give time to continue to mourn We will check about the Atrovent nasal spray for the constant runny nose and we will let [name] no when we make sure there is no drug interactions This winter keep the house as cool as possible and use nasal saline and Mucinex if needed for cough and congestion Drink plenty of fluids   Arrie Senate MD

## 2014-11-26 ENCOUNTER — Ambulatory Visit: Payer: Medicare Other | Admitting: Family Medicine

## 2014-11-26 LAB — BMP8+EGFR
BUN / CREAT RATIO: 26 (ref 11–26)
BUN: 24 mg/dL (ref 10–36)
CALCIUM: 9.4 mg/dL (ref 8.7–10.3)
CHLORIDE: 108 mmol/L (ref 97–108)
CO2: 23 mmol/L (ref 18–29)
Creatinine, Ser: 0.92 mg/dL (ref 0.57–1.00)
GFR calc Af Amer: 63 mL/min/{1.73_m2} (ref >59)
GFR calc non Af Amer: 54 mL/min/{1.73_m2} — ABNORMAL LOW (ref >59)
GLUCOSE: 97 mg/dL (ref 65–99)
Potassium: 4.2 mmol/L (ref 3.5–5.2)
Sodium: 145 mmol/L — ABNORMAL HIGH (ref 134–144)

## 2014-11-26 LAB — HEPATIC FUNCTION PANEL
ALT: 10 IU/L (ref 0–32)
AST: 17 IU/L (ref 0–40)
Albumin: 4 g/dL (ref 3.2–4.6)
Alkaline Phosphatase: 28 IU/L — ABNORMAL LOW (ref 39–117)
BILIRUBIN, DIRECT: 0.08 mg/dL (ref 0.00–0.40)
TOTAL PROTEIN: 6.4 g/dL (ref 6.0–8.5)

## 2014-11-26 LAB — CBC WITH DIFFERENTIAL/PLATELET
Basophils Absolute: 0 10*3/uL (ref 0.0–0.2)
Basos: 0 %
EOS (ABSOLUTE): 0.1 10*3/uL (ref 0.0–0.4)
EOS: 2 %
HEMATOCRIT: 38.5 % (ref 34.0–46.6)
HEMOGLOBIN: 12.5 g/dL (ref 11.1–15.9)
IMMATURE GRANS (ABS): 0 10*3/uL (ref 0.0–0.1)
Immature Granulocytes: 0 %
LYMPHS ABS: 1.9 10*3/uL (ref 0.7–3.1)
Lymphs: 30 %
MCH: 29.8 pg (ref 26.6–33.0)
MCHC: 32.5 g/dL (ref 31.5–35.7)
MCV: 92 fL (ref 79–97)
MONOCYTES: 11 %
Monocytes Absolute: 0.7 10*3/uL (ref 0.1–0.9)
Neutrophils Absolute: 3.6 10*3/uL (ref 1.4–7.0)
Neutrophils: 57 %
Platelets: 184 10*3/uL (ref 150–379)
RBC: 4.19 x10E6/uL (ref 3.77–5.28)
RDW: 14 % (ref 12.3–15.4)
WBC: 6.4 10*3/uL (ref 3.4–10.8)

## 2014-11-26 LAB — LIPID PANEL
CHOL/HDL RATIO: 2.7 ratio (ref 0.0–4.4)
Cholesterol, Total: 183 mg/dL (ref 100–199)
HDL: 67 mg/dL (ref >39)
LDL CALC: 100 mg/dL — AB (ref 0–99)
TRIGLYCERIDES: 80 mg/dL (ref 0–149)
VLDL CHOLESTEROL CAL: 16 mg/dL (ref 5–40)

## 2014-11-26 LAB — VITAMIN D 25 HYDROXY (VIT D DEFICIENCY, FRACTURES): Vit D, 25-Hydroxy: 66.5 ng/mL (ref 30.0–100.0)

## 2014-11-27 ENCOUNTER — Encounter: Payer: Self-pay | Admitting: Family Medicine

## 2015-01-01 ENCOUNTER — Other Ambulatory Visit: Payer: Self-pay | Admitting: Family Medicine

## 2015-02-25 ENCOUNTER — Other Ambulatory Visit: Payer: Self-pay | Admitting: Family Medicine

## 2015-02-25 ENCOUNTER — Encounter: Payer: Self-pay | Admitting: Family Medicine

## 2015-02-26 ENCOUNTER — Telehealth: Payer: Self-pay | Admitting: Family Medicine

## 2015-02-26 MED ORDER — AMLODIPINE BESYLATE 5 MG PO TABS: 2.5000 mg | ORAL_TABLET | Freq: Every day | ORAL | Status: DC

## 2015-02-26 MED ORDER — CEPHALEXIN 500 MG PO CAPS: 500.0000 mg | ORAL_CAPSULE | Freq: Two times a day (BID) | ORAL | Status: DC

## 2015-02-26 MED ORDER — GENTAMICIN SULFATE 0.3 % OP OINT: TOPICAL_OINTMENT | Freq: Three times a day (TID) | OPHTHALMIC | Status: DC

## 2015-02-26 NOTE — Telephone Encounter (Signed)
Is this medication the correct one

## 2015-02-26 NOTE — Telephone Encounter (Signed)
Patients caregiver states that Doniqua has a large stye on her eye and is concerned that she may need antibiotics. Nurse discussed with Dr Dettinger who ordered keflex 500mg  BID for 7 days. Caregiver notified and rx sent to [facility]

## 2015-05-12 ENCOUNTER — Other Ambulatory Visit: Payer: Self-pay | Admitting: Family Medicine

## 2015-05-19 ENCOUNTER — Encounter: Payer: Self-pay | Admitting: Family Medicine

## 2015-05-24 ENCOUNTER — Encounter: Payer: Self-pay | Admitting: Family Medicine

## 2015-05-24 DIAGNOSIS — R531 Weakness: Secondary | ICD-10-CM

## 2015-05-24 DIAGNOSIS — H401113 Primary open-angle glaucoma, right eye, severe stage: Secondary | ICD-10-CM | POA: Diagnosis not present

## 2015-05-24 DIAGNOSIS — R296 Repeated falls: Secondary | ICD-10-CM

## 2015-05-24 DIAGNOSIS — H401122 Primary open-angle glaucoma, left eye, moderate stage: Secondary | ICD-10-CM | POA: Diagnosis not present

## 2015-05-26 ENCOUNTER — Ambulatory Visit (INDEPENDENT_AMBULATORY_CARE_PROVIDER_SITE_OTHER): Payer: Medicare Other

## 2015-05-26 ENCOUNTER — Encounter: Payer: Self-pay | Admitting: *Deleted

## 2015-05-26 ENCOUNTER — Ambulatory Visit (INDEPENDENT_AMBULATORY_CARE_PROVIDER_SITE_OTHER): Payer: Medicare Other | Admitting: Family Medicine

## 2015-05-26 ENCOUNTER — Encounter: Payer: Self-pay | Admitting: Family Medicine

## 2015-05-26 VITALS — BP 117/62 | HR 60 | Temp 96.9°F | Ht 60.0 in | Wt 106.0 lb

## 2015-05-26 DIAGNOSIS — E785 Hyperlipidemia, unspecified: Secondary | ICD-10-CM

## 2015-05-26 DIAGNOSIS — R413 Other amnesia: Secondary | ICD-10-CM

## 2015-05-26 DIAGNOSIS — R296 Repeated falls: Secondary | ICD-10-CM | POA: Diagnosis not present

## 2015-05-26 DIAGNOSIS — I10 Essential (primary) hypertension: Secondary | ICD-10-CM

## 2015-05-26 DIAGNOSIS — E559 Vitamin D deficiency, unspecified: Secondary | ICD-10-CM

## 2015-05-26 DIAGNOSIS — R531 Weakness: Secondary | ICD-10-CM | POA: Diagnosis not present

## 2015-05-26 DIAGNOSIS — Z85850 Personal history of malignant neoplasm of thyroid: Secondary | ICD-10-CM | POA: Diagnosis not present

## 2015-05-26 DIAGNOSIS — R630 Anorexia: Secondary | ICD-10-CM | POA: Diagnosis not present

## 2015-05-26 DIAGNOSIS — C73 Malignant neoplasm of thyroid gland: Secondary | ICD-10-CM | POA: Diagnosis not present

## 2015-05-26 DIAGNOSIS — M4726 Other spondylosis with radiculopathy, lumbar region: Secondary | ICD-10-CM | POA: Diagnosis not present

## 2015-05-26 DIAGNOSIS — D692 Other nonthrombocytopenic purpura: Secondary | ICD-10-CM | POA: Diagnosis not present

## 2015-05-26 NOTE — Progress Notes (Signed)
Tammy,  Can you look through the pt's med list and check 2 things:  1- could any of her meds cause her to stay awake at night? 2 - what antidepressant would you recommend for her  - that would go well with other meds.

## 2015-05-26 NOTE — Progress Notes (Signed)
Subjective:    Patient ID: Bianca Thompson, female    DOB: 10-17-1920, 82 y.o.   MRN: [id number]  HPI Pt here for follow up and management of chronic medical problems which includes hyperlipidemia and hypertension. She is taking medications regularly.The patient continues to have a decreased appetite. She's had 3 separate falls. She is due to get a chest x-ray today. She will also get traditional lab work today. There is no refills that are requested today. The patient seems to be in a more positive mood today. She is less tearful and not crying like she was at previous visits. The husband has been gone for one year. Her daughter-in-law is concerned with her because she appears to be getting weaker and the patient says that she is afraid of falling. When she and her husband are with the patient she seems to eat better but otherwise does not eat very much. The son and daughter-in-law feel like a lot of her decreased appetite is due to her depression. The patient herself denies any chest pain or shortness of breath. She is swallowing her food without problems and having no nausea vomiting diarrhea blood in the stool or black tarry bowel movements. She is passing her water without any problems. She is using a cane for ambulation.     Patient Active Problem List   Diagnosis Date Noted  . Senile purpura ([place]) 07/08/2014  . History of thyroid cancer 08/22/2012  . Hyperlipidemia 08/22/2012  . Hypertension 08/22/2012  . Hyperthyroidism 08/22/2012  . Osteoporosis 08/22/2012  . Memory deficit 08/22/2012  . Degenerative arthritis of the lumbar spine 08/22/2012   Outpatient Encounter Prescriptions as of 05/26/2015  Medication Sig  . amLODipine (NORVASC) 5 MG tablet Take 0.5 tablets (2.5 mg total) by mouth daily.  . Ascorbic Acid (VITAMIN C) 500 MG tablet Take 500 mg by mouth daily.    Marland Kitchen aspirin (SB LOW DOSE ASA EC) 81 MG EC tablet Take 81 mg by mouth daily.    . calcium-vitamin D (OSCAL WITH D) 500-200  MG-UNIT per tablet Take 1 tablet by mouth 2 (two) times daily.  . Cholecalciferol (VITAMIN D) 2000 UNITS CAPS Take by mouth. Take 2 tablets by mouth Saturday and Sunday . Then on Monday -Friday take one tablet by mouth   . docusate sodium (COLACE) 100 MG capsule Take 100 mg by mouth daily.  Marland Kitchen donepezil (ARICEPT) 10 MG tablet TAKE ONE TABLET BY MOUTH ONCE DAILY  . dorzolamide-timolol (COSOPT) 22.3-6.8 MG/ML ophthalmic solution   . Garlic 2119 MG CAPS Take 1,000 capsules by mouth daily.  Marland Kitchen latanoprost (XALATAN) 0.005 % ophthalmic solution   . levothyroxine (SYNTHROID, LEVOTHROID) 112 MCG tablet TAKE ONE TABLET BY MOUTH ONCE DAILY  . Memantine HCl ER (NAMENDA XR) 28 MG CP24 TAKE ONE CAPSULE BY MOUTH ONCE DAILY AT BEDTIME  . Omega-3 Fatty Acids (FISH OIL) 1200 MG CAPS Take 2 capsules by mouth daily. Take 2-3 tablets by mouth daily  . quinapril (ACCUPRIL) 40 MG tablet TAKE ONE TABLET BY MOUTH ONCE DAILY  . raloxifene (EVISTA) 60 MG tablet TAKE ONE TABLET BY MOUTH ONCE DAILY  . [DISCONTINUED] cephALEXin (KEFLEX) 500 MG capsule Take 1 capsule (500 mg total) by mouth 2 (two) times daily.  . [DISCONTINUED] donepezil (ARICEPT) 10 MG tablet TAKE ONE TABLET BY MOUTH ONCE DAILY  . [DISCONTINUED] gentamicin (GARAMYCIN) 0.3 % ophthalmic ointment Apply to eye 3 (three) times daily.  . [DISCONTINUED] NAMENDA XR 28 MG CP24 24 hr capsule TAKE ONE  CAPSULE BY MOUTH ONCE DAILY AT BEDTIME   Facility-Administered Encounter Medications as of 05/26/2015  Medication  . cyanocobalamin ((VITAMIN B-12)) injection 1,000 mcg      Review of Systems  Constitutional: Positive for appetite change (decreased).  HENT: Negative.   Eyes: Negative.   Respiratory: Negative.   Cardiovascular: Negative.   Gastrointestinal: Negative.   Endocrine: Negative.   Genitourinary: Negative.   Musculoskeletal: Negative.   Skin: Negative.   Allergic/Immunologic: Negative.   Neurological: Negative.   Hematological: Negative.     Psychiatric/Behavioral: Negative.        Objective:   Physical Exam  Constitutional: She is oriented to person, place, and time. She appears well-developed and well-nourished. No distress.  HENT:  Head: Normocephalic and atraumatic.  Right Ear: External ear normal.  Left Ear: External ear normal.  Nose: Nose normal.  Mouth/Throat: Oropharynx is clear and moist.  Eyes: Conjunctivae and EOM are normal. Pupils are equal, round, and reactive to light. Right eye exhibits no discharge. Left eye exhibits no discharge. No scleral icterus.  Neck: Normal range of motion. Neck supple. No thyromegaly present.  Cardiovascular: Normal rate, regular rhythm and normal heart sounds.   No murmur heard. Heart is regular at 60/m  Pulmonary/Chest: Effort normal and breath sounds normal. No respiratory distress. She has no wheezes. She has no rales. She exhibits no tenderness.  Abdominal: Soft. Bowel sounds are normal. She exhibits no mass. There is no tenderness. There is no rebound and no guarding.  No abdominal tenderness or masses  Musculoskeletal: Normal range of motion. She exhibits no edema or tenderness.  The patient uses a cane for ambulation  Lymphadenopathy:    She has no cervical adenopathy.  Neurological: She is alert and oriented to person, place, and time.  Skin: Skin is warm and dry. No rash noted.  Psychiatric: She has a normal mood and affect. Her behavior is normal. Judgment and thought content normal.  The patient mood is more upbeat.  Nursing note and vitals reviewed.  BP 117/62 mmHg  Pulse 60  Temp(Src) 96.9 F (36.1 C) (Oral)  Ht 5' (1.524 m)  Wt 106 lb (48.081 kg)  BMI 20.70 kg/m2        Assessment & Plan:  1. Essential hypertension -The blood pressure is good today she will continue with current treatment - BMP8+EGFR - CBC with Differential/Platelet - Hepatic function panel - DG Chest 2 View; Future - Ambulatory referral to Home Health  2. Hyperlipidemia -And  10 you with therapeutic lifestyle changes - CBC with Differential/Platelet - Lipid panel - DG Chest 2 View; Future  3. Frequent falls -Home physical therapy to help with gait strengthening and muscle strengthening - CBC with Differential/Platelet - Ambulatory referral to [facility]  4. Weak -Home nursing to improve with by mouth intake and to stabilize weight loss - CBC with Differential/Platelet - Ambulatory referral to [facility]  5. Memory deficit -The patient's memory appears to be stable and she should continue with Namenda and Aricept - CBC with Differential/Platelet - Ambulatory referral to [facility]  6. Vitamin D deficiency -Continue current treatment pending results of lab work - CBC with Differential/Platelet - VITAMIN D 25 Hydroxy (Vit-D Deficiency, Fractures)  7. Senile purpura (HCC) -No increased bruising noted  8. Thyroid cancer ([place]) -Continue with thyroid therapy  9. Osteoarthritis of spine with radiculopathy, lumbar region -Work with physical therapy for more mobility - Ambulatory referral to [facility]  10. Decreased appetite -Work with home health to  increase caloric intake of daily food and nourishment - Ambulatory referral to [facility]  Patient Instructions                       Medicare Annual Wellness Visit  [facility] and the medical providers at [facility] strive to bring you the best medical care.  In doing so we not only want to address your current medical conditions and concerns but also to detect new conditions early and prevent illness, disease and health-related problems.    Medicare offers a yearly Wellness Visit which allows our clinical staff to assess your need for preventative services including immunizations, lifestyle education, counseling to decrease risk of preventable diseases and screening for fall risk and other medical concerns.    This visit is provided free of charge (no copay) for all  Medicare recipients. The clinical pharmacists at [facility] have begun to conduct these Wellness Visits which will also include a thorough review of all your medications.    As you primary medical provider recommend that you make an appointment for your Annual Wellness Visit if you have not done so already this year.  You may set up this appointment before you leave today or you may call back ([phone]) and schedule an appointment.  Please make sure when you call that you mention that you are scheduling your Annual Wellness Visit with the clinical pharmacist so that the appointment may be made for the proper length of time.     Continue current medications. Continue good therapeutic lifestyle changes which include good diet and exercise. Fall precautions discussed with patient. If an FOBT was given today- please return it to our front desk. If you are over 92 years old - you may need Prevnar 56 or the adult Pneumonia vaccine.  **Flu shots are available--- please call and schedule a FLU-CLINIC appointment**  After your visit with [place] today you will receive a survey in the mail or online from [employer] regarding your care with [place]. Please take a moment to fill this out. Your feedback is very important to [place] as you can help [place] better understand your patient needs as well as improve your experience and satisfaction. WE CARE ABOUT YOU!!!   We will arrange for physical therapy to come in and help with gait strengthening and walking Please do not go up and down any steps again We will have a home health nurse come in and see if she can help you with your diet so that you eat more nutritious food We will have her clinical pharmacist review your medication and consider starting you on a low-dose antidepressant. We will call your daughter-in-law with her recommendations We will be arranging for home health and home health nurse to come in to further evaluate the patient and  hopefully initiate physical therapy in the home for strengthening purposes and monitor the patient for weight gain.   Arrie Senate MD

## 2015-05-26 NOTE — Patient Instructions (Addendum)
Medicare Annual Wellness Visit  [facility] and the medical providers at [facility] strive to bring you the best medical care.  In doing so we not only want to address your current medical conditions and concerns but also to detect new conditions early and prevent illness, disease and health-related problems.    Medicare offers a yearly Wellness Visit which allows our clinical staff to assess your need for preventative services including immunizations, lifestyle education, counseling to decrease risk of preventable diseases and screening for fall risk and other medical concerns.    This visit is provided free of charge (no copay) for all Medicare recipients. The clinical pharmacists at [facility] have begun to conduct these Wellness Visits which will also include a thorough review of all your medications.    As you primary medical provider recommend that you make an appointment for your Annual Wellness Visit if you have not done so already this year.  You may set up this appointment before you leave today or you may call back [phone] and schedule an appointment.  Please make sure when you call that you mention that you are scheduling your Annual Wellness Visit with the clinical pharmacist so that the appointment may be made for the proper length of time.     Continue current medications. Continue good therapeutic lifestyle changes which include good diet and exercise. Fall precautions discussed with patient. If an FOBT was given today- please return it to our front desk. If you are over 19 years old - you may need Prevnar 59 or the adult Pneumonia vaccine.  **Flu shots are available--- please call and schedule a FLU-CLINIC appointment**  After your visit with [place] today you will receive a survey in the mail or online from [employer] regarding your care with [place]. Please take a moment to fill this out. Your feedback is very  important to [place] as you can help [place] better understand your patient needs as well as improve your experience and satisfaction. WE CARE ABOUT YOU!!!   We will arrange for physical therapy to come in and help with gait strengthening and walking Please do not go up and down any steps again We will have a home health nurse come in and see if she can help you with your diet so that you eat more nutritious food We will have her clinical pharmacist review your medication and consider starting you on a low-dose antidepressant. We will call your daughter-in-law with her recommendations We will be arranging for home health and home health nurse to come in to further evaluate the patient and hopefully initiate physical therapy in the home for strengthening purposes and monitor the patient for weight gain.

## 2015-05-27 LAB — LIPID PANEL
CHOL/HDL RATIO: 2.7 ratio (ref 0.0–4.4)
Cholesterol, Total: 186 mg/dL (ref 100–199)
HDL: 68 mg/dL (ref >39)
LDL Calculated: 97 mg/dL (ref 0–99)
TRIGLYCERIDES: 104 mg/dL (ref 0–149)
VLDL CHOLESTEROL CAL: 21 mg/dL (ref 5–40)

## 2015-05-27 LAB — BMP8+EGFR
BUN / CREAT RATIO: 22 (ref 12–28)
BUN: 21 mg/dL (ref 10–36)
CHLORIDE: 107 mmol/L — AB (ref 96–106)
CO2: 26 mmol/L (ref 18–29)
CREATININE: 0.96 mg/dL (ref 0.57–1.00)
Calcium: 9.3 mg/dL (ref 8.7–10.3)
GFR, EST AFRICAN AMERICAN: 59 mL/min/{1.73_m2} — AB (ref >59)
GFR, EST NON AFRICAN AMERICAN: 51 mL/min/{1.73_m2} — AB (ref >59)
GLUCOSE: 94 mg/dL (ref 65–99)
POTASSIUM: 4.4 mmol/L (ref 3.5–5.2)
Sodium: 147 mmol/L — ABNORMAL HIGH (ref 134–144)

## 2015-05-27 LAB — CBC WITH DIFFERENTIAL/PLATELET
BASOS ABS: 0 10*3/uL (ref 0.0–0.2)
BASOS: 0 %
EOS (ABSOLUTE): 0.2 10*3/uL (ref 0.0–0.4)
Eos: 2 %
Hematocrit: 36.4 % (ref 34.0–46.6)
Hemoglobin: 11.9 g/dL (ref 11.1–15.9)
IMMATURE GRANS (ABS): 0 10*3/uL (ref 0.0–0.1)
IMMATURE GRANULOCYTES: 0 %
LYMPHS: 32 %
Lymphocytes Absolute: 2.4 10*3/uL (ref 0.7–3.1)
MCH: 29.3 pg (ref 26.6–33.0)
MCHC: 32.7 g/dL (ref 31.5–35.7)
MCV: 90 fL (ref 79–97)
MONOS ABS: 0.7 10*3/uL (ref 0.1–0.9)
Monocytes: 9 %
NEUTROS PCT: 57 %
Neutrophils Absolute: 4.2 10*3/uL (ref 1.4–7.0)
PLATELETS: 194 10*3/uL (ref 150–379)
RBC: 4.06 x10E6/uL (ref 3.77–5.28)
RDW: 14.2 % (ref 12.3–15.4)
WBC: 7.5 10*3/uL (ref 3.4–10.8)

## 2015-05-27 LAB — VITAMIN D 25 HYDROXY (VIT D DEFICIENCY, FRACTURES): Vit D, 25-Hydroxy: 66.9 ng/mL (ref 30.0–100.0)

## 2015-05-27 LAB — HEPATIC FUNCTION PANEL
ALT: 11 IU/L (ref 0–32)
AST: 20 IU/L (ref 0–40)
Albumin: 4.1 g/dL (ref 3.2–4.6)
Alkaline Phosphatase: 30 IU/L — ABNORMAL LOW (ref 39–117)
BILIRUBIN, DIRECT: 0.07 mg/dL (ref 0.00–0.40)
TOTAL PROTEIN: 6.3 g/dL (ref 6.0–8.5)

## 2015-05-27 NOTE — Progress Notes (Signed)
Review clinical pharmacy recommendations with patient's daughter-in-law. Try reducing the Aricept to one half the current dose and see if depression and insomnia improve Have the patient's daughter-in-law call us back in 4 weeks

## 2015-05-27 NOTE — Progress Notes (Signed)
Both aricept / donepzil has a 2 to 14% incidence of causing insomnia (likely related to vivid dreams associated with cholinesterase inhibitors).  Also both aricept and namenda have about a 3% incidence of  Depression (maybe related to dementia and not necessarily the medication)  I would recommend consider the following:  (1) addition of SSRI - citalopram has been studied the most and with the most consistant resutls.  It was dosed from 10mg  to 30 mg daily in studies.  It can lengthen QT interval so EKG might be recommended if hasn't been done in awhile.  If this doesn't work can try Trintellix. (2) could lower aricept dose and see if sleep and depression improves.   (3) Melatonin has been shown to help with sleep and then sleep is better sometimes depressive symptoms improve - melatonin 5mg  1/2 to 1 hour prior to sleep

## 2015-05-28 LAB — THYROID PANEL WITH TSH
Free Thyroxine Index: 4.1 (ref 1.2–4.9)
T3 Uptake Ratio: 32 % (ref 24–39)
T4, Total: 12.8 ug/dL — ABNORMAL HIGH (ref 4.5–12.0)
TSH: 0.01 u[IU]/mL — AB (ref 0.450–4.500)

## 2015-05-28 LAB — SPECIMEN STATUS REPORT

## 2015-06-02 ENCOUNTER — Encounter: Payer: Self-pay | Admitting: Family Medicine

## 2015-06-03 ENCOUNTER — Telehealth: Payer: Self-pay | Admitting: *Deleted

## 2015-06-03 DIAGNOSIS — R413 Other amnesia: Secondary | ICD-10-CM | POA: Diagnosis not present

## 2015-06-03 DIAGNOSIS — I10 Essential (primary) hypertension: Secondary | ICD-10-CM | POA: Diagnosis not present

## 2015-06-03 DIAGNOSIS — M4726 Other spondylosis with radiculopathy, lumbar region: Secondary | ICD-10-CM | POA: Diagnosis not present

## 2015-06-03 DIAGNOSIS — R630 Anorexia: Secondary | ICD-10-CM | POA: Diagnosis not present

## 2015-06-03 DIAGNOSIS — M810 Age-related osteoporosis without current pathological fracture: Secondary | ICD-10-CM | POA: Diagnosis not present

## 2015-06-03 DIAGNOSIS — F329 Major depressive disorder, single episode, unspecified: Secondary | ICD-10-CM | POA: Diagnosis not present

## 2015-06-03 NOTE — Telephone Encounter (Signed)
Verbal order given for HHN FYI Per HHN pt's BP in both arms 92 58 Pt was assymptomatic After 2 min walk bp was 88 58 HHN encouraged increasing fluids

## 2015-06-08 DIAGNOSIS — R413 Other amnesia: Secondary | ICD-10-CM | POA: Diagnosis not present

## 2015-06-08 DIAGNOSIS — R630 Anorexia: Secondary | ICD-10-CM | POA: Diagnosis not present

## 2015-06-08 DIAGNOSIS — F329 Major depressive disorder, single episode, unspecified: Secondary | ICD-10-CM | POA: Diagnosis not present

## 2015-06-08 DIAGNOSIS — I10 Essential (primary) hypertension: Secondary | ICD-10-CM | POA: Diagnosis not present

## 2015-06-08 DIAGNOSIS — M4726 Other spondylosis with radiculopathy, lumbar region: Secondary | ICD-10-CM | POA: Diagnosis not present

## 2015-06-08 DIAGNOSIS — M810 Age-related osteoporosis without current pathological fracture: Secondary | ICD-10-CM | POA: Diagnosis not present

## 2015-06-09 DIAGNOSIS — F329 Major depressive disorder, single episode, unspecified: Secondary | ICD-10-CM | POA: Diagnosis not present

## 2015-06-09 DIAGNOSIS — M4726 Other spondylosis with radiculopathy, lumbar region: Secondary | ICD-10-CM | POA: Diagnosis not present

## 2015-06-09 DIAGNOSIS — I10 Essential (primary) hypertension: Secondary | ICD-10-CM | POA: Diagnosis not present

## 2015-06-09 DIAGNOSIS — R630 Anorexia: Secondary | ICD-10-CM | POA: Diagnosis not present

## 2015-06-09 DIAGNOSIS — R413 Other amnesia: Secondary | ICD-10-CM | POA: Diagnosis not present

## 2015-06-09 DIAGNOSIS — M810 Age-related osteoporosis without current pathological fracture: Secondary | ICD-10-CM | POA: Diagnosis not present

## 2015-06-10 DIAGNOSIS — R413 Other amnesia: Secondary | ICD-10-CM | POA: Diagnosis not present

## 2015-06-10 DIAGNOSIS — M810 Age-related osteoporosis without current pathological fracture: Secondary | ICD-10-CM | POA: Diagnosis not present

## 2015-06-10 DIAGNOSIS — R630 Anorexia: Secondary | ICD-10-CM | POA: Diagnosis not present

## 2015-06-10 DIAGNOSIS — F329 Major depressive disorder, single episode, unspecified: Secondary | ICD-10-CM | POA: Diagnosis not present

## 2015-06-10 DIAGNOSIS — M4726 Other spondylosis with radiculopathy, lumbar region: Secondary | ICD-10-CM | POA: Diagnosis not present

## 2015-06-10 DIAGNOSIS — I10 Essential (primary) hypertension: Secondary | ICD-10-CM | POA: Diagnosis not present

## 2015-06-15 DIAGNOSIS — M810 Age-related osteoporosis without current pathological fracture: Secondary | ICD-10-CM | POA: Diagnosis not present

## 2015-06-15 DIAGNOSIS — R413 Other amnesia: Secondary | ICD-10-CM | POA: Diagnosis not present

## 2015-06-15 DIAGNOSIS — R630 Anorexia: Secondary | ICD-10-CM | POA: Diagnosis not present

## 2015-06-15 DIAGNOSIS — I10 Essential (primary) hypertension: Secondary | ICD-10-CM | POA: Diagnosis not present

## 2015-06-15 DIAGNOSIS — F329 Major depressive disorder, single episode, unspecified: Secondary | ICD-10-CM | POA: Diagnosis not present

## 2015-06-15 DIAGNOSIS — M4726 Other spondylosis with radiculopathy, lumbar region: Secondary | ICD-10-CM | POA: Diagnosis not present

## 2015-06-16 DIAGNOSIS — M4726 Other spondylosis with radiculopathy, lumbar region: Secondary | ICD-10-CM | POA: Diagnosis not present

## 2015-06-16 DIAGNOSIS — F329 Major depressive disorder, single episode, unspecified: Secondary | ICD-10-CM | POA: Diagnosis not present

## 2015-06-16 DIAGNOSIS — I10 Essential (primary) hypertension: Secondary | ICD-10-CM | POA: Diagnosis not present

## 2015-06-16 DIAGNOSIS — R413 Other amnesia: Secondary | ICD-10-CM | POA: Diagnosis not present

## 2015-06-16 DIAGNOSIS — M810 Age-related osteoporosis without current pathological fracture: Secondary | ICD-10-CM | POA: Diagnosis not present

## 2015-06-16 DIAGNOSIS — R630 Anorexia: Secondary | ICD-10-CM | POA: Diagnosis not present

## 2015-06-17 ENCOUNTER — Encounter: Payer: Self-pay | Admitting: Family Medicine

## 2015-06-17 DIAGNOSIS — F329 Major depressive disorder, single episode, unspecified: Secondary | ICD-10-CM | POA: Diagnosis not present

## 2015-06-17 DIAGNOSIS — R413 Other amnesia: Secondary | ICD-10-CM | POA: Diagnosis not present

## 2015-06-17 DIAGNOSIS — R630 Anorexia: Secondary | ICD-10-CM | POA: Diagnosis not present

## 2015-06-17 DIAGNOSIS — M4726 Other spondylosis with radiculopathy, lumbar region: Secondary | ICD-10-CM | POA: Diagnosis not present

## 2015-06-17 DIAGNOSIS — M810 Age-related osteoporosis without current pathological fracture: Secondary | ICD-10-CM | POA: Diagnosis not present

## 2015-06-17 DIAGNOSIS — I10 Essential (primary) hypertension: Secondary | ICD-10-CM | POA: Diagnosis not present

## 2015-06-18 ENCOUNTER — Telehealth: Payer: Self-pay | Admitting: Family Medicine

## 2015-06-18 DIAGNOSIS — F329 Major depressive disorder, single episode, unspecified: Secondary | ICD-10-CM | POA: Diagnosis not present

## 2015-06-18 DIAGNOSIS — R630 Anorexia: Secondary | ICD-10-CM | POA: Diagnosis not present

## 2015-06-18 DIAGNOSIS — R413 Other amnesia: Secondary | ICD-10-CM | POA: Diagnosis not present

## 2015-06-18 DIAGNOSIS — M810 Age-related osteoporosis without current pathological fracture: Secondary | ICD-10-CM | POA: Diagnosis not present

## 2015-06-18 DIAGNOSIS — M4726 Other spondylosis with radiculopathy, lumbar region: Secondary | ICD-10-CM | POA: Diagnosis not present

## 2015-06-18 DIAGNOSIS — I10 Essential (primary) hypertension: Secondary | ICD-10-CM | POA: Diagnosis not present

## 2015-06-18 MED ORDER — ENSURE COMPLETE PO LIQD: 237.0000 mL | ORAL | Status: AC

## 2015-06-18 NOTE — Telephone Encounter (Signed)
Spoke with patient's daughter in law and reviewed Dr Tawanna Sat notes to her.   Patient has gained 2# over last 3 weeks with decrease aricept dose and increased social interactions.   I suggested holding aricept however daugher in law concerned of possibility of decreased mental functioning.  I do agree that this is a possibility and concern.  In reviewing the possible appetite stimulants such as megesterol and dronabinol there are mixed results in the elderly population (about 4# weight increase over 3 months) and concerns about side effects - edema / CHO and increased risk of DVT with megesterol and sedation, fatigue and hallucinations with dronabinol which makes them not choice agents to use inelderly population.  Remeron is also an agent use to stimulate appetite due has possible heart related side effects.   In light of weight improvement (although small) I tols daughter in law to continue with Ensure and ensure patient is drinking at least one whole can per day.  Continue to use Aricept 5mg  daily.  Continue to monitor weight.  If weight starts to decrease than call office and can consider appetite agent.

## 2015-06-19 ENCOUNTER — Encounter: Payer: Self-pay | Admitting: Family Medicine

## 2015-06-20 ENCOUNTER — Encounter (INDEPENDENT_AMBULATORY_CARE_PROVIDER_SITE_OTHER): Payer: Medicare Other | Admitting: Family Medicine

## 2015-06-20 DIAGNOSIS — I10 Essential (primary) hypertension: Secondary | ICD-10-CM

## 2015-06-20 DIAGNOSIS — M4726 Other spondylosis with radiculopathy, lumbar region: Secondary | ICD-10-CM

## 2015-06-20 DIAGNOSIS — R413 Other amnesia: Secondary | ICD-10-CM | POA: Diagnosis not present

## 2015-06-20 DIAGNOSIS — R630 Anorexia: Secondary | ICD-10-CM | POA: Diagnosis not present

## 2015-06-20 DIAGNOSIS — F329 Major depressive disorder, single episode, unspecified: Secondary | ICD-10-CM | POA: Diagnosis not present

## 2015-06-20 DIAGNOSIS — M810 Age-related osteoporosis without current pathological fracture: Secondary | ICD-10-CM

## 2015-06-22 DIAGNOSIS — F329 Major depressive disorder, single episode, unspecified: Secondary | ICD-10-CM | POA: Diagnosis not present

## 2015-06-22 DIAGNOSIS — M4726 Other spondylosis with radiculopathy, lumbar region: Secondary | ICD-10-CM | POA: Diagnosis not present

## 2015-06-22 DIAGNOSIS — R413 Other amnesia: Secondary | ICD-10-CM | POA: Diagnosis not present

## 2015-06-22 DIAGNOSIS — I10 Essential (primary) hypertension: Secondary | ICD-10-CM | POA: Diagnosis not present

## 2015-06-22 DIAGNOSIS — R630 Anorexia: Secondary | ICD-10-CM | POA: Diagnosis not present

## 2015-06-22 DIAGNOSIS — M810 Age-related osteoporosis without current pathological fracture: Secondary | ICD-10-CM | POA: Diagnosis not present

## 2015-06-23 DIAGNOSIS — M810 Age-related osteoporosis without current pathological fracture: Secondary | ICD-10-CM | POA: Diagnosis not present

## 2015-06-23 DIAGNOSIS — I10 Essential (primary) hypertension: Secondary | ICD-10-CM | POA: Diagnosis not present

## 2015-06-23 DIAGNOSIS — M4726 Other spondylosis with radiculopathy, lumbar region: Secondary | ICD-10-CM | POA: Diagnosis not present

## 2015-06-23 DIAGNOSIS — R413 Other amnesia: Secondary | ICD-10-CM | POA: Diagnosis not present

## 2015-06-23 DIAGNOSIS — F329 Major depressive disorder, single episode, unspecified: Secondary | ICD-10-CM | POA: Diagnosis not present

## 2015-06-23 DIAGNOSIS — R630 Anorexia: Secondary | ICD-10-CM | POA: Diagnosis not present

## 2015-06-24 DIAGNOSIS — M810 Age-related osteoporosis without current pathological fracture: Secondary | ICD-10-CM | POA: Diagnosis not present

## 2015-06-24 DIAGNOSIS — F329 Major depressive disorder, single episode, unspecified: Secondary | ICD-10-CM | POA: Diagnosis not present

## 2015-06-24 DIAGNOSIS — R630 Anorexia: Secondary | ICD-10-CM | POA: Diagnosis not present

## 2015-06-24 DIAGNOSIS — I10 Essential (primary) hypertension: Secondary | ICD-10-CM | POA: Diagnosis not present

## 2015-06-24 DIAGNOSIS — M4726 Other spondylosis with radiculopathy, lumbar region: Secondary | ICD-10-CM | POA: Diagnosis not present

## 2015-06-24 DIAGNOSIS — R413 Other amnesia: Secondary | ICD-10-CM | POA: Diagnosis not present

## 2015-06-25 DIAGNOSIS — I10 Essential (primary) hypertension: Secondary | ICD-10-CM | POA: Diagnosis not present

## 2015-06-25 DIAGNOSIS — R413 Other amnesia: Secondary | ICD-10-CM | POA: Diagnosis not present

## 2015-06-25 DIAGNOSIS — M4726 Other spondylosis with radiculopathy, lumbar region: Secondary | ICD-10-CM | POA: Diagnosis not present

## 2015-06-25 DIAGNOSIS — F329 Major depressive disorder, single episode, unspecified: Secondary | ICD-10-CM | POA: Diagnosis not present

## 2015-06-25 DIAGNOSIS — R630 Anorexia: Secondary | ICD-10-CM | POA: Diagnosis not present

## 2015-06-25 DIAGNOSIS — M810 Age-related osteoporosis without current pathological fracture: Secondary | ICD-10-CM | POA: Diagnosis not present

## 2015-06-28 DIAGNOSIS — R413 Other amnesia: Secondary | ICD-10-CM | POA: Diagnosis not present

## 2015-06-28 DIAGNOSIS — R630 Anorexia: Secondary | ICD-10-CM | POA: Diagnosis not present

## 2015-06-28 DIAGNOSIS — M810 Age-related osteoporosis without current pathological fracture: Secondary | ICD-10-CM | POA: Diagnosis not present

## 2015-06-28 DIAGNOSIS — I10 Essential (primary) hypertension: Secondary | ICD-10-CM | POA: Diagnosis not present

## 2015-06-28 DIAGNOSIS — F329 Major depressive disorder, single episode, unspecified: Secondary | ICD-10-CM | POA: Diagnosis not present

## 2015-06-28 DIAGNOSIS — M4726 Other spondylosis with radiculopathy, lumbar region: Secondary | ICD-10-CM | POA: Diagnosis not present

## 2015-06-29 DIAGNOSIS — M810 Age-related osteoporosis without current pathological fracture: Secondary | ICD-10-CM | POA: Diagnosis not present

## 2015-06-29 DIAGNOSIS — M4726 Other spondylosis with radiculopathy, lumbar region: Secondary | ICD-10-CM | POA: Diagnosis not present

## 2015-06-29 DIAGNOSIS — R413 Other amnesia: Secondary | ICD-10-CM | POA: Diagnosis not present

## 2015-06-29 DIAGNOSIS — R630 Anorexia: Secondary | ICD-10-CM | POA: Diagnosis not present

## 2015-06-29 DIAGNOSIS — I10 Essential (primary) hypertension: Secondary | ICD-10-CM | POA: Diagnosis not present

## 2015-06-29 DIAGNOSIS — F329 Major depressive disorder, single episode, unspecified: Secondary | ICD-10-CM | POA: Diagnosis not present

## 2015-06-30 DIAGNOSIS — F329 Major depressive disorder, single episode, unspecified: Secondary | ICD-10-CM | POA: Diagnosis not present

## 2015-06-30 DIAGNOSIS — I10 Essential (primary) hypertension: Secondary | ICD-10-CM | POA: Diagnosis not present

## 2015-06-30 DIAGNOSIS — M4726 Other spondylosis with radiculopathy, lumbar region: Secondary | ICD-10-CM | POA: Diagnosis not present

## 2015-06-30 DIAGNOSIS — R630 Anorexia: Secondary | ICD-10-CM | POA: Diagnosis not present

## 2015-06-30 DIAGNOSIS — M810 Age-related osteoporosis without current pathological fracture: Secondary | ICD-10-CM | POA: Diagnosis not present

## 2015-06-30 DIAGNOSIS — R413 Other amnesia: Secondary | ICD-10-CM | POA: Diagnosis not present

## 2015-07-01 DIAGNOSIS — M810 Age-related osteoporosis without current pathological fracture: Secondary | ICD-10-CM | POA: Diagnosis not present

## 2015-07-01 DIAGNOSIS — I10 Essential (primary) hypertension: Secondary | ICD-10-CM | POA: Diagnosis not present

## 2015-07-01 DIAGNOSIS — R413 Other amnesia: Secondary | ICD-10-CM | POA: Diagnosis not present

## 2015-07-01 DIAGNOSIS — M4726 Other spondylosis with radiculopathy, lumbar region: Secondary | ICD-10-CM | POA: Diagnosis not present

## 2015-07-01 DIAGNOSIS — R630 Anorexia: Secondary | ICD-10-CM | POA: Diagnosis not present

## 2015-07-01 DIAGNOSIS — F329 Major depressive disorder, single episode, unspecified: Secondary | ICD-10-CM | POA: Diagnosis not present

## 2015-07-02 DIAGNOSIS — R413 Other amnesia: Secondary | ICD-10-CM | POA: Diagnosis not present

## 2015-07-02 DIAGNOSIS — M4726 Other spondylosis with radiculopathy, lumbar region: Secondary | ICD-10-CM | POA: Diagnosis not present

## 2015-07-02 DIAGNOSIS — I10 Essential (primary) hypertension: Secondary | ICD-10-CM | POA: Diagnosis not present

## 2015-07-02 DIAGNOSIS — R630 Anorexia: Secondary | ICD-10-CM | POA: Diagnosis not present

## 2015-07-02 DIAGNOSIS — F329 Major depressive disorder, single episode, unspecified: Secondary | ICD-10-CM | POA: Diagnosis not present

## 2015-07-02 DIAGNOSIS — M810 Age-related osteoporosis without current pathological fracture: Secondary | ICD-10-CM | POA: Diagnosis not present

## 2015-07-05 DIAGNOSIS — M4726 Other spondylosis with radiculopathy, lumbar region: Secondary | ICD-10-CM | POA: Diagnosis not present

## 2015-07-05 DIAGNOSIS — F329 Major depressive disorder, single episode, unspecified: Secondary | ICD-10-CM | POA: Diagnosis not present

## 2015-07-05 DIAGNOSIS — M810 Age-related osteoporosis without current pathological fracture: Secondary | ICD-10-CM | POA: Diagnosis not present

## 2015-07-05 DIAGNOSIS — R630 Anorexia: Secondary | ICD-10-CM | POA: Diagnosis not present

## 2015-07-05 DIAGNOSIS — R413 Other amnesia: Secondary | ICD-10-CM | POA: Diagnosis not present

## 2015-07-05 DIAGNOSIS — I10 Essential (primary) hypertension: Secondary | ICD-10-CM | POA: Diagnosis not present

## 2015-07-06 DIAGNOSIS — R413 Other amnesia: Secondary | ICD-10-CM | POA: Diagnosis not present

## 2015-07-06 DIAGNOSIS — M810 Age-related osteoporosis without current pathological fracture: Secondary | ICD-10-CM | POA: Diagnosis not present

## 2015-07-06 DIAGNOSIS — F329 Major depressive disorder, single episode, unspecified: Secondary | ICD-10-CM | POA: Diagnosis not present

## 2015-07-06 DIAGNOSIS — M4726 Other spondylosis with radiculopathy, lumbar region: Secondary | ICD-10-CM | POA: Diagnosis not present

## 2015-07-06 DIAGNOSIS — I10 Essential (primary) hypertension: Secondary | ICD-10-CM | POA: Diagnosis not present

## 2015-07-06 DIAGNOSIS — R630 Anorexia: Secondary | ICD-10-CM | POA: Diagnosis not present

## 2015-07-07 DIAGNOSIS — M810 Age-related osteoporosis without current pathological fracture: Secondary | ICD-10-CM | POA: Diagnosis not present

## 2015-07-07 DIAGNOSIS — I10 Essential (primary) hypertension: Secondary | ICD-10-CM | POA: Diagnosis not present

## 2015-07-07 DIAGNOSIS — F329 Major depressive disorder, single episode, unspecified: Secondary | ICD-10-CM | POA: Diagnosis not present

## 2015-07-07 DIAGNOSIS — M4726 Other spondylosis with radiculopathy, lumbar region: Secondary | ICD-10-CM | POA: Diagnosis not present

## 2015-07-07 DIAGNOSIS — R630 Anorexia: Secondary | ICD-10-CM | POA: Diagnosis not present

## 2015-07-07 DIAGNOSIS — R413 Other amnesia: Secondary | ICD-10-CM | POA: Diagnosis not present

## 2015-07-08 DIAGNOSIS — R630 Anorexia: Secondary | ICD-10-CM | POA: Diagnosis not present

## 2015-07-08 DIAGNOSIS — R413 Other amnesia: Secondary | ICD-10-CM | POA: Diagnosis not present

## 2015-07-08 DIAGNOSIS — M810 Age-related osteoporosis without current pathological fracture: Secondary | ICD-10-CM | POA: Diagnosis not present

## 2015-07-08 DIAGNOSIS — F329 Major depressive disorder, single episode, unspecified: Secondary | ICD-10-CM | POA: Diagnosis not present

## 2015-07-08 DIAGNOSIS — I10 Essential (primary) hypertension: Secondary | ICD-10-CM | POA: Diagnosis not present

## 2015-07-08 DIAGNOSIS — M4726 Other spondylosis with radiculopathy, lumbar region: Secondary | ICD-10-CM | POA: Diagnosis not present

## 2015-07-09 DIAGNOSIS — F329 Major depressive disorder, single episode, unspecified: Secondary | ICD-10-CM | POA: Diagnosis not present

## 2015-07-09 DIAGNOSIS — M4726 Other spondylosis with radiculopathy, lumbar region: Secondary | ICD-10-CM | POA: Diagnosis not present

## 2015-07-09 DIAGNOSIS — R413 Other amnesia: Secondary | ICD-10-CM | POA: Diagnosis not present

## 2015-07-09 DIAGNOSIS — I10 Essential (primary) hypertension: Secondary | ICD-10-CM | POA: Diagnosis not present

## 2015-07-09 DIAGNOSIS — R630 Anorexia: Secondary | ICD-10-CM | POA: Diagnosis not present

## 2015-07-09 DIAGNOSIS — M810 Age-related osteoporosis without current pathological fracture: Secondary | ICD-10-CM | POA: Diagnosis not present

## 2015-07-12 DIAGNOSIS — R630 Anorexia: Secondary | ICD-10-CM | POA: Diagnosis not present

## 2015-07-12 DIAGNOSIS — R413 Other amnesia: Secondary | ICD-10-CM | POA: Diagnosis not present

## 2015-07-12 DIAGNOSIS — M4726 Other spondylosis with radiculopathy, lumbar region: Secondary | ICD-10-CM | POA: Diagnosis not present

## 2015-07-12 DIAGNOSIS — I10 Essential (primary) hypertension: Secondary | ICD-10-CM | POA: Diagnosis not present

## 2015-07-12 DIAGNOSIS — F329 Major depressive disorder, single episode, unspecified: Secondary | ICD-10-CM | POA: Diagnosis not present

## 2015-07-12 DIAGNOSIS — M810 Age-related osteoporosis without current pathological fracture: Secondary | ICD-10-CM | POA: Diagnosis not present

## 2015-07-13 DIAGNOSIS — I10 Essential (primary) hypertension: Secondary | ICD-10-CM | POA: Diagnosis not present

## 2015-07-13 DIAGNOSIS — R413 Other amnesia: Secondary | ICD-10-CM | POA: Diagnosis not present

## 2015-07-13 DIAGNOSIS — R630 Anorexia: Secondary | ICD-10-CM | POA: Diagnosis not present

## 2015-07-13 DIAGNOSIS — M810 Age-related osteoporosis without current pathological fracture: Secondary | ICD-10-CM | POA: Diagnosis not present

## 2015-07-13 DIAGNOSIS — F329 Major depressive disorder, single episode, unspecified: Secondary | ICD-10-CM | POA: Diagnosis not present

## 2015-07-13 DIAGNOSIS — M4726 Other spondylosis with radiculopathy, lumbar region: Secondary | ICD-10-CM | POA: Diagnosis not present

## 2015-07-14 DIAGNOSIS — F329 Major depressive disorder, single episode, unspecified: Secondary | ICD-10-CM | POA: Diagnosis not present

## 2015-07-14 DIAGNOSIS — M4726 Other spondylosis with radiculopathy, lumbar region: Secondary | ICD-10-CM | POA: Diagnosis not present

## 2015-07-14 DIAGNOSIS — R413 Other amnesia: Secondary | ICD-10-CM | POA: Diagnosis not present

## 2015-07-14 DIAGNOSIS — I10 Essential (primary) hypertension: Secondary | ICD-10-CM | POA: Diagnosis not present

## 2015-07-14 DIAGNOSIS — R630 Anorexia: Secondary | ICD-10-CM | POA: Diagnosis not present

## 2015-07-14 DIAGNOSIS — M810 Age-related osteoporosis without current pathological fracture: Secondary | ICD-10-CM | POA: Diagnosis not present

## 2015-07-15 DIAGNOSIS — I10 Essential (primary) hypertension: Secondary | ICD-10-CM | POA: Diagnosis not present

## 2015-07-15 DIAGNOSIS — M810 Age-related osteoporosis without current pathological fracture: Secondary | ICD-10-CM | POA: Diagnosis not present

## 2015-07-15 DIAGNOSIS — F329 Major depressive disorder, single episode, unspecified: Secondary | ICD-10-CM | POA: Diagnosis not present

## 2015-07-15 DIAGNOSIS — R630 Anorexia: Secondary | ICD-10-CM | POA: Diagnosis not present

## 2015-07-15 DIAGNOSIS — R413 Other amnesia: Secondary | ICD-10-CM | POA: Diagnosis not present

## 2015-07-15 DIAGNOSIS — M4726 Other spondylosis with radiculopathy, lumbar region: Secondary | ICD-10-CM | POA: Diagnosis not present

## 2015-07-21 DIAGNOSIS — M810 Age-related osteoporosis without current pathological fracture: Secondary | ICD-10-CM | POA: Diagnosis not present

## 2015-07-21 DIAGNOSIS — R413 Other amnesia: Secondary | ICD-10-CM | POA: Diagnosis not present

## 2015-07-21 DIAGNOSIS — I10 Essential (primary) hypertension: Secondary | ICD-10-CM | POA: Diagnosis not present

## 2015-07-21 DIAGNOSIS — F329 Major depressive disorder, single episode, unspecified: Secondary | ICD-10-CM | POA: Diagnosis not present

## 2015-07-21 DIAGNOSIS — M4726 Other spondylosis with radiculopathy, lumbar region: Secondary | ICD-10-CM | POA: Diagnosis not present

## 2015-07-21 DIAGNOSIS — R630 Anorexia: Secondary | ICD-10-CM | POA: Diagnosis not present

## 2015-07-23 DIAGNOSIS — F329 Major depressive disorder, single episode, unspecified: Secondary | ICD-10-CM | POA: Diagnosis not present

## 2015-07-23 DIAGNOSIS — R630 Anorexia: Secondary | ICD-10-CM | POA: Diagnosis not present

## 2015-07-23 DIAGNOSIS — M810 Age-related osteoporosis without current pathological fracture: Secondary | ICD-10-CM | POA: Diagnosis not present

## 2015-07-23 DIAGNOSIS — R413 Other amnesia: Secondary | ICD-10-CM | POA: Diagnosis not present

## 2015-07-23 DIAGNOSIS — I10 Essential (primary) hypertension: Secondary | ICD-10-CM | POA: Diagnosis not present

## 2015-07-23 DIAGNOSIS — M4726 Other spondylosis with radiculopathy, lumbar region: Secondary | ICD-10-CM | POA: Diagnosis not present

## 2015-07-27 DIAGNOSIS — I10 Essential (primary) hypertension: Secondary | ICD-10-CM | POA: Diagnosis not present

## 2015-07-27 DIAGNOSIS — F329 Major depressive disorder, single episode, unspecified: Secondary | ICD-10-CM | POA: Diagnosis not present

## 2015-07-27 DIAGNOSIS — R630 Anorexia: Secondary | ICD-10-CM | POA: Diagnosis not present

## 2015-07-27 DIAGNOSIS — M4726 Other spondylosis with radiculopathy, lumbar region: Secondary | ICD-10-CM | POA: Diagnosis not present

## 2015-07-27 DIAGNOSIS — R413 Other amnesia: Secondary | ICD-10-CM | POA: Diagnosis not present

## 2015-07-27 DIAGNOSIS — M810 Age-related osteoporosis without current pathological fracture: Secondary | ICD-10-CM | POA: Diagnosis not present

## 2015-07-28 DIAGNOSIS — M4726 Other spondylosis with radiculopathy, lumbar region: Secondary | ICD-10-CM | POA: Diagnosis not present

## 2015-07-28 DIAGNOSIS — I10 Essential (primary) hypertension: Secondary | ICD-10-CM | POA: Diagnosis not present

## 2015-07-28 DIAGNOSIS — R630 Anorexia: Secondary | ICD-10-CM | POA: Diagnosis not present

## 2015-07-28 DIAGNOSIS — M810 Age-related osteoporosis without current pathological fracture: Secondary | ICD-10-CM | POA: Diagnosis not present

## 2015-07-28 DIAGNOSIS — R413 Other amnesia: Secondary | ICD-10-CM | POA: Diagnosis not present

## 2015-07-28 DIAGNOSIS — F329 Major depressive disorder, single episode, unspecified: Secondary | ICD-10-CM | POA: Diagnosis not present

## 2015-07-29 DIAGNOSIS — M810 Age-related osteoporosis without current pathological fracture: Secondary | ICD-10-CM | POA: Diagnosis not present

## 2015-07-29 DIAGNOSIS — R413 Other amnesia: Secondary | ICD-10-CM | POA: Diagnosis not present

## 2015-07-29 DIAGNOSIS — M4726 Other spondylosis with radiculopathy, lumbar region: Secondary | ICD-10-CM | POA: Diagnosis not present

## 2015-07-29 DIAGNOSIS — R630 Anorexia: Secondary | ICD-10-CM | POA: Diagnosis not present

## 2015-07-29 DIAGNOSIS — F329 Major depressive disorder, single episode, unspecified: Secondary | ICD-10-CM | POA: Diagnosis not present

## 2015-07-29 DIAGNOSIS — I10 Essential (primary) hypertension: Secondary | ICD-10-CM | POA: Diagnosis not present

## 2015-08-18 ENCOUNTER — Other Ambulatory Visit: Payer: Self-pay | Admitting: *Deleted

## 2015-08-18 MED ORDER — RALOXIFENE HCL 60 MG PO TABS: 60.0000 mg | ORAL_TABLET | Freq: Every day | ORAL | Status: DC

## 2015-08-18 MED ORDER — LEVOTHYROXINE SODIUM 112 MCG PO TABS: 112.0000 ug | ORAL_TABLET | Freq: Every day | ORAL | Status: DC

## 2015-08-18 MED ORDER — QUINAPRIL HCL 40 MG PO TABS: 40.0000 mg | ORAL_TABLET | Freq: Every day | ORAL | Status: DC

## 2015-09-02 ENCOUNTER — Ambulatory Visit (INDEPENDENT_AMBULATORY_CARE_PROVIDER_SITE_OTHER): Payer: Medicare Other | Admitting: Family Medicine

## 2015-09-02 ENCOUNTER — Encounter: Payer: Self-pay | Admitting: Family Medicine

## 2015-09-02 ENCOUNTER — Telehealth: Payer: Self-pay | Admitting: Family Medicine

## 2015-09-02 VITALS — BP 127/67 | HR 66 | Temp 97.5°F | Ht 61.52 in | Wt 104.4 lb

## 2015-09-02 DIAGNOSIS — R296 Repeated falls: Secondary | ICD-10-CM | POA: Diagnosis not present

## 2015-09-02 DIAGNOSIS — R413 Other amnesia: Secondary | ICD-10-CM | POA: Diagnosis not present

## 2015-09-02 DIAGNOSIS — S51811A Laceration without foreign body of right forearm, initial encounter: Secondary | ICD-10-CM | POA: Diagnosis not present

## 2015-09-02 MED ORDER — MUPIROCIN 2 % EX OINT: 1.0000 "application " | TOPICAL_OINTMENT | Freq: Two times a day (BID) | CUTANEOUS | Status: DC

## 2015-09-02 NOTE — Telephone Encounter (Signed)
Has appt today

## 2015-09-02 NOTE — Patient Instructions (Signed)
Great to meet you!  Bianca Thompson will work on getting Home health arranged for you  Use the mupirocin on the wound twice daily

## 2015-09-02 NOTE — Progress Notes (Signed)
HPI  Patient presents today here with a new arm lesion.  Patient explains that she fell yesterday, she changes her answer intermittently and appears to have difficulty remembering.  She also fell back in June. Yesterday she fell and hit her arm. She had a small skin tear there she's been cleaning and changing dressings on. Her and her daughter present and request home health due to frequent falls, memory loss, and this wound.  PMH: Smoking status noted ROS: Per HPI  Objective: BP 127/67 mmHg  Pulse 66  Temp(Src) 97.5 F (36.4 C) (Oral)  Ht 5' 1.52" (1.563 m)  Wt 104 lb 6.4 oz (47.356 kg)  BMI 19.38 kg/m2 Gen: NAD, alert, cooperative with exam HEENT: NCAT CV: RRR, good S1/S2, no murmur Resp: CTABL, no wheezes, non-labored Ext: No edema, warm Neuro: Alert and oriented, No gross deficits  Skin Right forearm with 2 areas of skin tear, the larger is 1.4 cm x 3.8 cm, the smaller is 1 cm x 0.6 cm No drainage, erythema, warmth, or signs of infection.  Assessment and plan:  # Skin tear No signs of infection Dressed and wrapped in clinic today using Coban to avoid skin tears Discussed wound care and usual course of illness  # Frequent falls, memory deficit Refer to home health for in-home physical therapy and gait training. Memory deficit is stable. She does seem to be having more falls than previous.  Orders Placed This Encounter  Procedures  . Home Health    Order Specific Question:  To provide the following care/treatments    Answer:  PT    Order Specific Question:  To provide the following care/treatments    Answer:  RN  . Face-to-face encounter (required for Medicare/Medicaid patients)    I Kenn File certify that this patient is under my care and that I, or a nurse practitioner or physician's assistant working with me, had a face-to-face encounter that meets the physician face-to-face encounter requirements with this patient on 09/02/2015. The encounter with the  patient was in whole, or in part for the following medical condition(s) which is the primary reason for home health care (List medical condition): Skin tear, memory deficit, frequent falls.    Order Specific Question:  The encounter with the patient was in whole, or in part, for the following medical condition, which is the primary reason for home health care    Answer:  Enteric, memory deficit, and frequent falls.    Order Specific Question:  I certify that, based on my findings, the following services are medically necessary home health services    Answer:  Nursing    Order Specific Question:  Reason for Medically Necessary Home Health Services    Answer:  Skilled Nursing- Skilled Assessment/Observation    Order Specific Question:  Reason for Medically Necessary Home Health Services    Answer:  Therapy- Personnel officer, Adult nurse Specific Question:  My clinical findings support the need for the above services    Answer:  Cognitive impairments, dementia, or mental confusion  that make it unsafe to leave home    Order Specific Question:  Further, I certify that my clinical findings support that this patient is homebound due to:    Answer:  Unable to leave home safely without assistance    No orders of the defined types were placed in this encounter.    Laroy Apple, MD [facility] 09/02/2015, 2:20 PM

## 2015-09-06 ENCOUNTER — Telehealth: Payer: Self-pay | Admitting: Family Medicine

## 2015-09-06 NOTE — Telephone Encounter (Signed)
Referral sent to [facility], daughter aware

## 2015-09-07 DIAGNOSIS — W19XXXD Unspecified fall, subsequent encounter: Secondary | ICD-10-CM | POA: Diagnosis not present

## 2015-09-07 DIAGNOSIS — R296 Repeated falls: Secondary | ICD-10-CM | POA: Diagnosis not present

## 2015-09-07 DIAGNOSIS — R413 Other amnesia: Secondary | ICD-10-CM | POA: Diagnosis not present

## 2015-09-07 DIAGNOSIS — S51811D Laceration without foreign body of right forearm, subsequent encounter: Secondary | ICD-10-CM | POA: Diagnosis not present

## 2015-09-09 DIAGNOSIS — R296 Repeated falls: Secondary | ICD-10-CM | POA: Diagnosis not present

## 2015-09-09 DIAGNOSIS — S51811D Laceration without foreign body of right forearm, subsequent encounter: Secondary | ICD-10-CM | POA: Diagnosis not present

## 2015-09-09 DIAGNOSIS — R413 Other amnesia: Secondary | ICD-10-CM | POA: Diagnosis not present

## 2015-09-09 DIAGNOSIS — W19XXXD Unspecified fall, subsequent encounter: Secondary | ICD-10-CM | POA: Diagnosis not present

## 2015-09-13 DIAGNOSIS — R413 Other amnesia: Secondary | ICD-10-CM | POA: Diagnosis not present

## 2015-09-13 DIAGNOSIS — S51811D Laceration without foreign body of right forearm, subsequent encounter: Secondary | ICD-10-CM | POA: Diagnosis not present

## 2015-09-13 DIAGNOSIS — R296 Repeated falls: Secondary | ICD-10-CM | POA: Diagnosis not present

## 2015-09-13 DIAGNOSIS — W19XXXD Unspecified fall, subsequent encounter: Secondary | ICD-10-CM | POA: Diagnosis not present

## 2015-09-17 DIAGNOSIS — R413 Other amnesia: Secondary | ICD-10-CM | POA: Diagnosis not present

## 2015-09-17 DIAGNOSIS — W19XXXD Unspecified fall, subsequent encounter: Secondary | ICD-10-CM | POA: Diagnosis not present

## 2015-09-17 DIAGNOSIS — S51811D Laceration without foreign body of right forearm, subsequent encounter: Secondary | ICD-10-CM | POA: Diagnosis not present

## 2015-09-17 DIAGNOSIS — R296 Repeated falls: Secondary | ICD-10-CM | POA: Diagnosis not present

## 2015-09-20 DIAGNOSIS — S51811D Laceration without foreign body of right forearm, subsequent encounter: Secondary | ICD-10-CM | POA: Diagnosis not present

## 2015-09-20 DIAGNOSIS — R413 Other amnesia: Secondary | ICD-10-CM | POA: Diagnosis not present

## 2015-09-20 DIAGNOSIS — W19XXXD Unspecified fall, subsequent encounter: Secondary | ICD-10-CM | POA: Diagnosis not present

## 2015-09-20 DIAGNOSIS — R296 Repeated falls: Secondary | ICD-10-CM | POA: Diagnosis not present

## 2015-09-22 DIAGNOSIS — R296 Repeated falls: Secondary | ICD-10-CM | POA: Diagnosis not present

## 2015-09-22 DIAGNOSIS — R413 Other amnesia: Secondary | ICD-10-CM | POA: Diagnosis not present

## 2015-09-22 DIAGNOSIS — S51811D Laceration without foreign body of right forearm, subsequent encounter: Secondary | ICD-10-CM | POA: Diagnosis not present

## 2015-09-22 DIAGNOSIS — W19XXXD Unspecified fall, subsequent encounter: Secondary | ICD-10-CM | POA: Diagnosis not present

## 2015-09-28 DIAGNOSIS — R296 Repeated falls: Secondary | ICD-10-CM | POA: Diagnosis not present

## 2015-09-28 DIAGNOSIS — S51811D Laceration without foreign body of right forearm, subsequent encounter: Secondary | ICD-10-CM | POA: Diagnosis not present

## 2015-09-28 DIAGNOSIS — R413 Other amnesia: Secondary | ICD-10-CM | POA: Diagnosis not present

## 2015-09-28 DIAGNOSIS — W19XXXD Unspecified fall, subsequent encounter: Secondary | ICD-10-CM | POA: Diagnosis not present

## 2015-09-29 ENCOUNTER — Encounter: Payer: Self-pay | Admitting: Family Medicine

## 2015-09-29 DIAGNOSIS — S51811D Laceration without foreign body of right forearm, subsequent encounter: Secondary | ICD-10-CM | POA: Diagnosis not present

## 2015-09-29 DIAGNOSIS — R413 Other amnesia: Secondary | ICD-10-CM | POA: Diagnosis not present

## 2015-09-29 DIAGNOSIS — R296 Repeated falls: Secondary | ICD-10-CM | POA: Diagnosis not present

## 2015-09-29 DIAGNOSIS — W19XXXD Unspecified fall, subsequent encounter: Secondary | ICD-10-CM | POA: Diagnosis not present

## 2015-09-30 DIAGNOSIS — R413 Other amnesia: Secondary | ICD-10-CM | POA: Diagnosis not present

## 2015-09-30 DIAGNOSIS — S51811D Laceration without foreign body of right forearm, subsequent encounter: Secondary | ICD-10-CM | POA: Diagnosis not present

## 2015-09-30 DIAGNOSIS — R296 Repeated falls: Secondary | ICD-10-CM | POA: Diagnosis not present

## 2015-09-30 DIAGNOSIS — W19XXXD Unspecified fall, subsequent encounter: Secondary | ICD-10-CM | POA: Diagnosis not present

## 2015-10-05 ENCOUNTER — Other Ambulatory Visit: Payer: Self-pay | Admitting: Family Medicine

## 2015-10-06 ENCOUNTER — Encounter: Payer: Self-pay | Admitting: Family Medicine

## 2015-10-06 ENCOUNTER — Ambulatory Visit (INDEPENDENT_AMBULATORY_CARE_PROVIDER_SITE_OTHER): Payer: Medicare Other | Admitting: Family Medicine

## 2015-10-06 VITALS — BP 116/65 | HR 66 | Temp 97.0°F | Ht 61.25 in | Wt 106.0 lb

## 2015-10-06 DIAGNOSIS — C73 Malignant neoplasm of thyroid gland: Secondary | ICD-10-CM | POA: Diagnosis not present

## 2015-10-06 DIAGNOSIS — R296 Repeated falls: Secondary | ICD-10-CM

## 2015-10-06 DIAGNOSIS — E559 Vitamin D deficiency, unspecified: Secondary | ICD-10-CM

## 2015-10-06 DIAGNOSIS — S51811D Laceration without foreign body of right forearm, subsequent encounter: Secondary | ICD-10-CM | POA: Diagnosis not present

## 2015-10-06 DIAGNOSIS — R413 Other amnesia: Secondary | ICD-10-CM

## 2015-10-06 DIAGNOSIS — R54 Age-related physical debility: Secondary | ICD-10-CM

## 2015-10-06 DIAGNOSIS — I10 Essential (primary) hypertension: Secondary | ICD-10-CM

## 2015-10-06 DIAGNOSIS — W19XXXD Unspecified fall, subsequent encounter: Secondary | ICD-10-CM | POA: Diagnosis not present

## 2015-10-06 DIAGNOSIS — D692 Other nonthrombocytopenic purpura: Secondary | ICD-10-CM

## 2015-10-06 DIAGNOSIS — E785 Hyperlipidemia, unspecified: Secondary | ICD-10-CM | POA: Diagnosis not present

## 2015-10-06 NOTE — Progress Notes (Signed)
Subjective:    Patient ID: Bianca Thompson, female    DOB: 12-11-1920, 82 y.o.   MRN: [id number]  HPI Pt here for follow up and management of chronic medical problems which includes hypertension and hyperlipidemia. She is taking medications regularly.The patient is stable and doing well. She enjoys working in her flowers and she is getting physical therapy from home physical therapy currently in her home. She comes to the visit today with her daughter-in-law [name]. She has a history of hyperthyroidism secondary to taking medication for thyroid cancer in the past. She has senile purpura and hypertension along with elevated cholesterol. Her blood pressure today is good. Her weight is up a couple pounds. Her oxygen level was good at 100%. She will be given an FOBT to return. She has a nurse visit once a week and physical therapy 2 times weekly. She also brings in notes from O2 levels and some blood pressure readings from the outside. The patient denies any problems with chest pain shortness of breath trouble swallowing heartburn indigestion nausea vomiting diarrhea blood in the stool or black tarry bowel movements. She is passing her water without problems. Her daughter-in-law indicates that she does not drink enough fluids and the patient says that she does. Other issues that are happening is that she is climbing up and down steps to the basement and there is really nothing in the basement that she needs. We talked to the daughter about putting a locker room at door so she would climb up and down anymore. She also has a ramp that goes to the backyard that she's not using them ran out she is using the steps instead.     Patient Active Problem List   Diagnosis Date Noted  . Senile purpura ([place]) 07/08/2014  . Thyroid cancer ([place]) 08/22/2012  . Hyperlipidemia 08/22/2012  . Hypertension 08/22/2012  . Hyperthyroidism 08/22/2012  . Osteoporosis 08/22/2012  . Memory deficit 08/22/2012  . Degenerative  arthritis of the lumbar spine 08/22/2012   Outpatient Encounter Prescriptions as of 10/06/2015  Medication Sig  . amLODipine (NORVASC) 5 MG tablet Take 0.5 tablets (2.5 mg total) by mouth daily.  . Ascorbic Acid (VITAMIN C) 500 MG tablet Take 500 mg by mouth daily.    Marland Kitchen aspirin (SB LOW DOSE ASA EC) 81 MG EC tablet Take 81 mg by mouth daily.    . calcium-vitamin D (OSCAL WITH D) 500-200 MG-UNIT per tablet Take 1 tablet by mouth 2 (two) times daily.  . Cholecalciferol (VITAMIN D) 2000 UNITS CAPS Take by mouth. Take 2 tablets by mouth Saturday and Sunday . Then on Monday -Friday take one tablet by mouth   . docusate sodium (COLACE) 100 MG capsule Take 100 mg by mouth daily.  Marland Kitchen donepezil (ARICEPT) 10 MG tablet TAKE ONE TABLET BY MOUTH ONCE DAILY  . dorzolamide-timolol (COSOPT) 22.3-6.8 MG/ML ophthalmic solution   . feeding supplement, ENSURE COMPLETE, (ENSURE COMPLETE) LIQD Take 237 mLs by mouth daily.  . Garlic 7253 MG CAPS Take 1,000 capsules by mouth daily.  Marland Kitchen latanoprost (XALATAN) 0.005 % ophthalmic solution   . levothyroxine (SYNTHROID, LEVOTHROID) 112 MCG tablet Take 1 tablet (112 mcg total) by mouth daily.  . Memantine HCl ER (NAMENDA XR) 28 MG CP24 TAKE ONE CAPSULE BY MOUTH ONCE DAILY AT BEDTIME  . Omega-3 Fatty Acids (FISH OIL) 1200 MG CAPS Take 2 capsules by mouth daily. Take 2-3 tablets by mouth daily  . quinapril (ACCUPRIL) 40 MG tablet Take 1 tablet (40 mg  total) by mouth daily.  . raloxifene (EVISTA) 60 MG tablet Take 1 tablet (60 mg total) by mouth daily.  . [DISCONTINUED] NAMENDA XR 28 MG CP24 24 hr capsule TAKE ONE CAPSULE BY MOUTH ONCE DAILY AT BEDTIME  . [DISCONTINUED] mupirocin ointment (BACTROBAN) 2 % Place 1 application into the nose 2 (two) times daily.   Facility-Administered Encounter Medications as of 10/06/2015  Medication  . cyanocobalamin ((VITAMIN B-12)) injection 1,000 mcg      Review of Systems  Constitutional: Negative.   HENT: Negative.   Eyes: Negative.     Respiratory: Negative.   Cardiovascular: Negative.   Gastrointestinal: Negative.   Endocrine: Negative.   Genitourinary: Negative.   Musculoskeletal: Negative.   Skin: Negative.   Allergic/Immunologic: Negative.   Neurological: Negative.   Hematological: Negative.   Psychiatric/Behavioral: Negative.        Objective:   Physical Exam  Constitutional: She is oriented to person, place, and time. She appears well-developed and well-nourished. No distress.  Elderly but alert and diminished hearing  HENT:  Head: Normocephalic and atraumatic.  Right Ear: External ear normal.  Left Ear: External ear normal.  Nose: Nose normal.  Eyes: Conjunctivae and EOM are normal. Pupils are equal, round, and reactive to light. Right eye exhibits no discharge. Left eye exhibits no discharge. No scleral icterus.  Neck: Normal range of motion. Neck supple. No thyromegaly present.  Bruits or thyromegaly  Cardiovascular: Normal rate, regular rhythm and normal heart sounds.   No murmur heard. Heart has a regular rate and rhythm at 72/m  Pulmonary/Chest: Effort normal and breath sounds normal. No respiratory distress. She has no wheezes. She has no rales.  Clear anteriorly and posteriorly  Abdominal: Soft. Bowel sounds are normal. She exhibits no mass. There is no tenderness. There is no rebound and no guarding.  No abdominal tenderness bruits organ enlargement or masses  Musculoskeletal: Normal range of motion. She exhibits no edema.  The patient is using a cane for ambulation  Lymphadenopathy:    She has no cervical adenopathy.  Neurological: She is alert and oriented to person, place, and time. She has normal reflexes. No cranial nerve deficit.  Skin: Skin is warm and dry. No rash noted.  Psychiatric: She has a normal mood and affect. Her behavior is normal. Judgment and thought content normal.  Mood and affect much more positive than in the past.  Nursing note and vitals reviewed.  BP 116/65 (BP  Location: Right Arm)   Pulse 66   Temp 97 F (36.1 C) (Oral)   Ht 5' 1.25" (1.556 m)   Wt 106 lb (48.1 kg)   BMI 19.87 kg/m         Assessment & Plan:  1. Memory deficit -The patient seems to be stable with her current dose of Aricept and Namenda and we will continue that for the time being - CBC with Differential/Platelet  2. Frequent falls -She has had a couple of falls but is able to get herself up from that position. -We did encourage her not to go to the basement and to use the ramp is been prepared for her to go to to the outside and she did listen weather she will follow those instructions would not we are not sure. - CBC with Differential/Platelet  3. Hyperlipidemia -Continue with current treatment pending results of lab work - CBC with Differential/Platelet - Lipid panel  4. Essential hypertension -Blood pressure is good today and she will continue with current treatment -  BMP8+EGFR - CBC with Differential/Platelet - Hepatic function panel  5. Vitamin D deficiency -Continue with current treatment pending results of lab work - CBC with Differential/Platelet - VITAMIN D 25 Hydroxy (Vit-D Deficiency, Fractures)  6. Senile purpura ([place]) -The patient describes no bleeding issues or problems. She still has some senile purpura. - CBC with Differential/Platelet  7. Thyroid cancer ([place]) -The patient has had thyroid cancer and she is currently on thyroid replacement and a hyperthyroid days. - CBC with Differential/Platelet  8. Frailty -She is elderly and we'll have to be careful with her movements and she will have to make sure that she uses her cane and walker if needed.  No orders of the defined types were placed in this encounter.  Patient Instructions                       Medicare Annual Wellness Visit  [facility] and the medical providers at [facility] strive to bring you the best medical care.  In doing so we not only want to  address your current medical conditions and concerns but also to detect new conditions early and prevent illness, disease and health-related problems.    Medicare offers a yearly Wellness Visit which allows our clinical staff to assess your need for preventative services including immunizations, lifestyle education, counseling to decrease risk of preventable diseases and screening for fall risk and other medical concerns.    This visit is provided free of charge (no copay) for all Medicare recipients. The clinical pharmacists at [facility] have begun to conduct these Wellness Visits which will also include a thorough review of all your medications.    As you primary medical provider recommend that you make an appointment for your Annual Wellness Visit if you have not done so already this year.  You may set up this appointment before you leave today or you may call back [phone]) and schedule an appointment.  Please make sure when you call that you mention that you are scheduling your Annual Wellness Visit with the clinical pharmacist so that the appointment may be made for the proper length of time.     Continue current medications. Continue good therapeutic lifestyle changes which include good diet and exercise. Fall precautions discussed with patient. If an FOBT was given today- please return it to our front desk. If you are over 77 years old - you may need Prevnar 39 or the adult Pneumonia vaccine.   After your visit with [place] today you will receive a survey in the mail or online from [employer] regarding your care with [place]. Please take a moment to fill this out. Your feedback is very important to [place] as you can help [place] better understand your patient needs as well as improve your experience and satisfaction. WE CARE ABOUT YOU!!!   Continue to take current medicines Continue physical therapy as long as they will continue to come to the home If more therapy is needed there  would probably be an opportunity in the future to resume the physical therapy at home or she can come to the rehabilitation center next-door. Remember to do all you can to discourage going up and down steps with the patient. She should be encouraged to drink plenty of water and fluids and stay well hydrated Retry the Flonase 1 spray each nostril and nasal saline can be used frequently in each nostril each day.    Arrie Senate MD

## 2015-10-06 NOTE — Patient Instructions (Addendum)
Medicare Annual Wellness Visit  [facility] and the medical providers at [facility] strive to bring you the best medical care.  In doing so we not only want to address your current medical conditions and concerns but also to detect new conditions early and prevent illness, disease and health-related problems.    Medicare offers a yearly Wellness Visit which allows our clinical staff to assess your need for preventative services including immunizations, lifestyle education, counseling to decrease risk of preventable diseases and screening for fall risk and other medical concerns.    This visit is provided free of charge (no copay) for all Medicare recipients. The clinical pharmacists at [facility] have begun to conduct these Wellness Visits which will also include a thorough review of all your medications.    As you primary medical provider recommend that you make an appointment for your Annual Wellness Visit if you have not done so already this year.  You may set up this appointment before you leave today or you may call back [phone] and schedule an appointment.  Please make sure when you call that you mention that you are scheduling your Annual Wellness Visit with the clinical pharmacist so that the appointment may be made for the proper length of time.     Continue current medications. Continue good therapeutic lifestyle changes which include good diet and exercise. Fall precautions discussed with patient. If an FOBT was given today- please return it to our front desk. If you are over 19 years old - you may need Prevnar 25 or the adult Pneumonia vaccine.   After your visit with [place] today you will receive a survey in the mail or online from [employer] regarding your care with [place]. Please take a moment to fill this out. Your feedback is very important to [place] as you can help [place] better understand your patient needs as well as  improve your experience and satisfaction. WE CARE ABOUT YOU!!!   Continue to take current medicines Continue physical therapy as long as they will continue to come to the home If more therapy is needed there would probably be an opportunity in the future to resume the physical therapy at home or she can come to the rehabilitation center next-door. Remember to do all you can to discourage going up and down steps with the patient. She should be encouraged to drink plenty of water and fluids and stay well hydrated Retry the Flonase 1 spray each nostril and nasal saline can be used frequently in each nostril each day.

## 2015-10-07 LAB — HEPATIC FUNCTION PANEL
ALT: 8 IU/L (ref 0–32)
AST: 13 IU/L (ref 0–40)
Albumin: 4 g/dL (ref 3.2–4.6)
Alkaline Phosphatase: 32 IU/L — ABNORMAL LOW (ref 39–117)
BILIRUBIN, DIRECT: 0.06 mg/dL (ref 0.00–0.40)
Bilirubin Total: <0.2 mg/dL (ref 0.0–1.2)
Total Protein: 6.4 g/dL (ref 6.0–8.5)

## 2015-10-07 LAB — LIPID PANEL
Chol/HDL Ratio: 2.4 ratio units (ref 0.0–4.4)
Cholesterol, Total: 172 mg/dL (ref 100–199)
HDL: 73 mg/dL (ref >39)
LDL Calculated: 79 mg/dL (ref 0–99)
Triglycerides: 102 mg/dL (ref 0–149)
VLDL Cholesterol Cal: 20 mg/dL (ref 5–40)

## 2015-10-07 LAB — CBC WITH DIFFERENTIAL/PLATELET
BASOS: 0 %
Basophils Absolute: 0 10*3/uL (ref 0.0–0.2)
EOS (ABSOLUTE): 0.2 10*3/uL (ref 0.0–0.4)
Eos: 2 %
Hematocrit: 37.8 % (ref 34.0–46.6)
Hemoglobin: 12.2 g/dL (ref 11.1–15.9)
IMMATURE GRANS (ABS): 0 10*3/uL (ref 0.0–0.1)
Immature Granulocytes: 0 %
LYMPHS ABS: 2 10*3/uL (ref 0.7–3.1)
Lymphs: 27 %
MCH: 29.9 pg (ref 26.6–33.0)
MCHC: 32.3 g/dL (ref 31.5–35.7)
MCV: 93 fL (ref 79–97)
MONOS ABS: 0.8 10*3/uL (ref 0.1–0.9)
Monocytes: 11 %
NEUTROS ABS: 4.4 10*3/uL (ref 1.4–7.0)
Neutrophils: 60 %
PLATELETS: 180 10*3/uL (ref 150–379)
RBC: 4.08 x10E6/uL (ref 3.77–5.28)
RDW: 14.3 % (ref 12.3–15.4)
WBC: 7.4 10*3/uL (ref 3.4–10.8)

## 2015-10-07 LAB — BMP8+EGFR
BUN / CREAT RATIO: 26 (ref 12–28)
BUN: 30 mg/dL (ref 10–36)
CHLORIDE: 105 mmol/L (ref 96–106)
CO2: 25 mmol/L (ref 18–29)
CREATININE: 1.17 mg/dL — AB (ref 0.57–1.00)
Calcium: 9.3 mg/dL (ref 8.7–10.3)
GFR, EST AFRICAN AMERICAN: 46 mL/min/{1.73_m2} — AB (ref >59)
GFR, EST NON AFRICAN AMERICAN: 40 mL/min/{1.73_m2} — AB (ref >59)
GLUCOSE: 102 mg/dL — AB (ref 65–99)
POTASSIUM: 4.9 mmol/L (ref 3.5–5.2)
SODIUM: 146 mmol/L — AB (ref 134–144)

## 2015-10-07 LAB — VITAMIN D 25 HYDROXY (VIT D DEFICIENCY, FRACTURES): Vit D, 25-Hydroxy: 76.9 ng/mL (ref 30.0–100.0)

## 2015-10-08 DIAGNOSIS — S51811D Laceration without foreign body of right forearm, subsequent encounter: Secondary | ICD-10-CM | POA: Diagnosis not present

## 2015-10-08 DIAGNOSIS — R296 Repeated falls: Secondary | ICD-10-CM | POA: Diagnosis not present

## 2015-10-08 DIAGNOSIS — R413 Other amnesia: Secondary | ICD-10-CM | POA: Diagnosis not present

## 2015-10-08 DIAGNOSIS — W19XXXD Unspecified fall, subsequent encounter: Secondary | ICD-10-CM | POA: Diagnosis not present

## 2015-10-12 DIAGNOSIS — R296 Repeated falls: Secondary | ICD-10-CM | POA: Diagnosis not present

## 2015-10-12 DIAGNOSIS — R413 Other amnesia: Secondary | ICD-10-CM | POA: Diagnosis not present

## 2015-10-12 DIAGNOSIS — W19XXXD Unspecified fall, subsequent encounter: Secondary | ICD-10-CM | POA: Diagnosis not present

## 2015-10-12 DIAGNOSIS — S51811D Laceration without foreign body of right forearm, subsequent encounter: Secondary | ICD-10-CM | POA: Diagnosis not present

## 2015-10-14 DIAGNOSIS — R413 Other amnesia: Secondary | ICD-10-CM | POA: Diagnosis not present

## 2015-10-14 DIAGNOSIS — S51811D Laceration without foreign body of right forearm, subsequent encounter: Secondary | ICD-10-CM | POA: Diagnosis not present

## 2015-10-14 DIAGNOSIS — W19XXXD Unspecified fall, subsequent encounter: Secondary | ICD-10-CM | POA: Diagnosis not present

## 2015-10-14 DIAGNOSIS — R296 Repeated falls: Secondary | ICD-10-CM | POA: Diagnosis not present

## 2015-10-18 DIAGNOSIS — W19XXXD Unspecified fall, subsequent encounter: Secondary | ICD-10-CM | POA: Diagnosis not present

## 2015-10-18 DIAGNOSIS — S51811D Laceration without foreign body of right forearm, subsequent encounter: Secondary | ICD-10-CM | POA: Diagnosis not present

## 2015-10-18 DIAGNOSIS — R296 Repeated falls: Secondary | ICD-10-CM | POA: Diagnosis not present

## 2015-10-18 DIAGNOSIS — R413 Other amnesia: Secondary | ICD-10-CM | POA: Diagnosis not present

## 2015-10-21 ENCOUNTER — Ambulatory Visit (INDEPENDENT_AMBULATORY_CARE_PROVIDER_SITE_OTHER): Payer: Medicare Other | Admitting: Family Medicine

## 2015-10-21 DIAGNOSIS — S51811D Laceration without foreign body of right forearm, subsequent encounter: Secondary | ICD-10-CM | POA: Diagnosis not present

## 2015-10-21 DIAGNOSIS — W19XXXD Unspecified fall, subsequent encounter: Secondary | ICD-10-CM | POA: Diagnosis not present

## 2015-10-21 DIAGNOSIS — R413 Other amnesia: Secondary | ICD-10-CM

## 2015-10-21 DIAGNOSIS — R296 Repeated falls: Secondary | ICD-10-CM

## 2015-10-26 DIAGNOSIS — S51811D Laceration without foreign body of right forearm, subsequent encounter: Secondary | ICD-10-CM | POA: Diagnosis not present

## 2015-10-26 DIAGNOSIS — W19XXXD Unspecified fall, subsequent encounter: Secondary | ICD-10-CM | POA: Diagnosis not present

## 2015-10-26 DIAGNOSIS — R296 Repeated falls: Secondary | ICD-10-CM | POA: Diagnosis not present

## 2015-10-26 DIAGNOSIS — R413 Other amnesia: Secondary | ICD-10-CM | POA: Diagnosis not present

## 2015-10-29 DIAGNOSIS — S51811D Laceration without foreign body of right forearm, subsequent encounter: Secondary | ICD-10-CM | POA: Diagnosis not present

## 2015-10-29 DIAGNOSIS — R296 Repeated falls: Secondary | ICD-10-CM | POA: Diagnosis not present

## 2015-10-29 DIAGNOSIS — R413 Other amnesia: Secondary | ICD-10-CM | POA: Diagnosis not present

## 2015-10-29 DIAGNOSIS — W19XXXD Unspecified fall, subsequent encounter: Secondary | ICD-10-CM | POA: Diagnosis not present

## 2015-11-02 DIAGNOSIS — W19XXXD Unspecified fall, subsequent encounter: Secondary | ICD-10-CM | POA: Diagnosis not present

## 2015-11-02 DIAGNOSIS — R296 Repeated falls: Secondary | ICD-10-CM | POA: Diagnosis not present

## 2015-11-02 DIAGNOSIS — S51811D Laceration without foreign body of right forearm, subsequent encounter: Secondary | ICD-10-CM | POA: Diagnosis not present

## 2015-11-02 DIAGNOSIS — R413 Other amnesia: Secondary | ICD-10-CM | POA: Diagnosis not present

## 2015-11-03 ENCOUNTER — Ambulatory Visit: Payer: Medicare Other | Admitting: Family Medicine

## 2015-11-03 ENCOUNTER — Telehealth: Payer: Self-pay | Admitting: Family Medicine

## 2015-11-03 DIAGNOSIS — R296 Repeated falls: Secondary | ICD-10-CM | POA: Diagnosis not present

## 2015-11-03 DIAGNOSIS — S51811D Laceration without foreign body of right forearm, subsequent encounter: Secondary | ICD-10-CM | POA: Diagnosis not present

## 2015-11-03 DIAGNOSIS — R413 Other amnesia: Secondary | ICD-10-CM | POA: Diagnosis not present

## 2015-11-03 DIAGNOSIS — W19XXXD Unspecified fall, subsequent encounter: Secondary | ICD-10-CM | POA: Diagnosis not present

## 2015-11-03 MED ORDER — POLYMYXIN B-TRIMETHOPRIM 10000-0.1 UNIT/ML-% OP SOLN: 1.0000 [drp] | OPHTHALMIC | 0 refills | Status: DC

## 2015-11-03 NOTE — Telephone Encounter (Signed)
Per [facility] - we will increase aricept to a whole tab  And  Quinapril 40 will be cut in 1/2.  [name] aware

## 2015-11-03 NOTE — Telephone Encounter (Signed)
Please advise 

## 2015-11-03 NOTE — Telephone Encounter (Signed)
Please call and Polytrim ophthalmic drops and let daughter-in-law noted this is been called in. It may be difficult for [place] to repeat initiate home health because they have discontinued their services and they would not come back just for the eye infection.

## 2015-11-03 NOTE — Telephone Encounter (Signed)
BP running low 90/60, 100/60 - this is random times == is this ok, or should we cut back on something?   She is more agitated, gets 1 thing on her mind -- could they go back up to the full strength aricept?

## 2015-11-04 DIAGNOSIS — W19XXXD Unspecified fall, subsequent encounter: Secondary | ICD-10-CM | POA: Diagnosis not present

## 2015-11-04 DIAGNOSIS — R413 Other amnesia: Secondary | ICD-10-CM | POA: Diagnosis not present

## 2015-11-04 DIAGNOSIS — R296 Repeated falls: Secondary | ICD-10-CM | POA: Diagnosis not present

## 2015-11-04 DIAGNOSIS — S51811D Laceration without foreign body of right forearm, subsequent encounter: Secondary | ICD-10-CM | POA: Diagnosis not present

## 2015-11-08 DIAGNOSIS — H401122 Primary open-angle glaucoma, left eye, moderate stage: Secondary | ICD-10-CM | POA: Diagnosis not present

## 2015-11-08 DIAGNOSIS — H401113 Primary open-angle glaucoma, right eye, severe stage: Secondary | ICD-10-CM | POA: Diagnosis not present

## 2015-11-10 ENCOUNTER — Other Ambulatory Visit: Payer: Self-pay | Admitting: Family Medicine

## 2015-11-24 ENCOUNTER — Other Ambulatory Visit: Payer: Self-pay

## 2015-11-24 ENCOUNTER — Other Ambulatory Visit: Payer: Self-pay | Admitting: Family Medicine

## 2015-11-24 MED ORDER — DONEPEZIL HCL 10 MG PO TABS: 10.0000 mg | ORAL_TABLET | Freq: Every day | ORAL | 0 refills | Status: DC

## 2016-01-24 ENCOUNTER — Other Ambulatory Visit: Payer: Self-pay | Admitting: Family Medicine

## 2016-02-08 ENCOUNTER — Ambulatory Visit: Payer: Medicare Other | Admitting: Family Medicine

## 2016-02-17 ENCOUNTER — Ambulatory Visit (INDEPENDENT_AMBULATORY_CARE_PROVIDER_SITE_OTHER): Payer: Medicare Other | Admitting: Family Medicine

## 2016-02-17 ENCOUNTER — Encounter: Payer: Self-pay | Admitting: Family Medicine

## 2016-02-17 VITALS — BP 91/65 | HR 67 | Temp 97.0°F | Ht 61.25 in | Wt 101.0 lb

## 2016-02-17 DIAGNOSIS — E7800 Pure hypercholesterolemia, unspecified: Secondary | ICD-10-CM

## 2016-02-17 DIAGNOSIS — Z23 Encounter for immunization: Secondary | ICD-10-CM | POA: Diagnosis not present

## 2016-02-17 DIAGNOSIS — F322 Major depressive disorder, single episode, severe without psychotic features: Secondary | ICD-10-CM | POA: Diagnosis not present

## 2016-02-17 DIAGNOSIS — I10 Essential (primary) hypertension: Secondary | ICD-10-CM

## 2016-02-17 DIAGNOSIS — D692 Other nonthrombocytopenic purpura: Secondary | ICD-10-CM

## 2016-02-17 DIAGNOSIS — E559 Vitamin D deficiency, unspecified: Secondary | ICD-10-CM

## 2016-02-17 DIAGNOSIS — C73 Malignant neoplasm of thyroid gland: Secondary | ICD-10-CM

## 2016-02-17 DIAGNOSIS — R413 Other amnesia: Secondary | ICD-10-CM

## 2016-02-17 MED ORDER — ESCITALOPRAM OXALATE 10 MG PO TABS: 10.0000 mg | ORAL_TABLET | Freq: Every day | ORAL | 1 refills | Status: DC

## 2016-02-17 NOTE — Patient Instructions (Addendum)
Medicare Annual Wellness Visit  [facility] [facility] [place] and the medical providers at [facility] strive to bring you the best medical care.  In doing so we not only want to address your current medical conditions and concerns but also to detect new conditions early and prevent illness, disease and health-related problems.    Medicare offers a yearly Wellness Visit which allows our clinical staff to assess your need for preventative services including immunizations, lifestyle education, counseling to decrease risk of preventable diseases and screening for fall risk and other medical concerns.    This visit is provided free of charge (no copay) for all Medicare recipients. The clinical pharmacists at [facility] have begun to conduct these Wellness Visits which will also include a thorough review of all your medications.    As you primary medical provider recommend that you make an appointment for your Annual Wellness Visit if you have not done so already this year.  You may set up this appointment before you leave today or you may call back [phone] and schedule an appointment.  Please make sure when you call that you mention that you are scheduling your Annual Wellness Visit with the clinical pharmacist so that the appointment may be made for the proper length of time.     Continue current medications. Continue good therapeutic lifestyle changes which include good diet and exercise. Fall precautions discussed with patient. If an FOBT was given today- please return it to our front desk. If you are over 25 years old - you may need Prevnar 8 or the adult Pneumonia vaccine.  **Flu shots are available--- please call and schedule a FLU-CLINIC appointment**  After your visit with [place] today you will receive a survey in the mail or online from [employer] regarding your care with [place]. Please take a moment to fill this out. Your feedback is very  important to [place] as you can help [place] better understand your patient needs as well as improve your experience and satisfaction. WE CARE ABOUT YOU!!!   Take the antidepressant once daily at night and call us in about 4 weeks and let us know if it is helping any or not.

## 2016-02-17 NOTE — Progress Notes (Addendum)
Subjective:    Patient ID: Bianca Thompson, female    DOB: [birth year], 82 y.o.   MRN: [id number]  HPI Pt here for follow up and management of chronic medical problems which includes hyperlipidemia and hypertension. She is taking  Medications regularly.The patient comes to the visit today with her daughter-in-law. The Accupril had been reduced to 20 mg daily and the blood pressures been better. She continues to not eat a lot and cries a lot. She is due to get an FOBT and lab work today and also will get her flu shot. The patient remains depressed and still grieving over losing her husband almost 2 years ago. Has any chest pain or shortness of breath. She denies any problems with her stomach including nausea vomiting diarrhea blood in the stool or black tarry bowel movements. She is passing her water without problems. She remains tearful and very depressed regarding the loss of her husband has lost 5 more pounds since her last visit. She has one son and his wife is a retired Company secretary and they're very supportive of the patient.     Patient Active Problem List   Diagnosis Date Noted  . Senile purpura ([place]) 07/08/2014  . Thyroid cancer ([place]) 08/22/2012  . Hyperlipidemia 08/22/2012  . Hypertension 08/22/2012  . Hyperthyroidism 08/22/2012  . Osteoporosis 08/22/2012  . Memory deficit 08/22/2012  . Degenerative arthritis of the lumbar spine 08/22/2012   Outpatient Encounter Prescriptions as of 02/17/2016  Medication Sig  . amLODipine (NORVASC) 5 MG tablet Take 0.5 tablets (2.5 mg total) by mouth daily.  . Ascorbic Acid (VITAMIN C) 500 MG tablet Take 500 mg by mouth daily.    Marland Kitchen aspirin (SB LOW DOSE ASA EC) 81 MG EC tablet Take 81 mg by mouth daily.    . calcium-vitamin D (OSCAL WITH D) 500-200 MG-UNIT per tablet Take 1 tablet by mouth 2 (two) times daily.  . Cholecalciferol (VITAMIN D) 2000 UNITS CAPS Take by mouth. Take 2 tablets by mouth Saturday and Sunday . Then on Monday -Friday take one tablet  by mouth   . docusate sodium (COLACE) 100 MG capsule Take 100 mg by mouth daily.  Marland Kitchen donepezil (ARICEPT) 10 MG tablet TAKE ONE TABLET BY MOUTH ONCE DAILY  . dorzolamide-timolol (COSOPT) 22.3-6.8 MG/ML ophthalmic solution   . feeding supplement, ENSURE COMPLETE, (ENSURE COMPLETE) LIQD Take 237 mLs by mouth daily.  . Garlic 1017 MG CAPS Take 1,000 capsules by mouth daily.  Marland Kitchen latanoprost (XALATAN) 0.005 % ophthalmic solution   . levothyroxine (SYNTHROID, LEVOTHROID) 112 MCG tablet TAKE ONE TABLET BY MOUTH ONCE DAILY  . Memantine HCl ER (NAMENDA XR) 28 MG CP24 TAKE ONE CAPSULE BY MOUTH ONCE DAILY AT BEDTIME  . Omega-3 Fatty Acids (FISH OIL) 1200 MG CAPS Take 2 capsules by mouth daily. Take 2-3 tablets by mouth daily  . quinapril (ACCUPRIL) 40 MG tablet TAKE ONE TABLET BY MOUTH ONCE DAILY  . raloxifene (EVISTA) 60 MG tablet TAKE ONE TABLET BY MOUTH ONCE DAILY  . [DISCONTINUED] NAMENDA XR 28 MG CP24 24 hr capsule TAKE ONE CAPSULE BY MOUTH AT BEDTIME  . [DISCONTINUED] trimethoprim-polymyxin b (POLYTRIM) ophthalmic solution Place 1 drop into both eyes every 4 (four) hours.   Facility-Administered Encounter Medications as of 02/17/2016  Medication  . cyanocobalamin ((VITAMIN B-12)) injection 1,000 mcg     Review of Systems  Constitutional: Positive for appetite change (not eating much ).  HENT: Negative.   Eyes: Negative.   Respiratory: Negative.   Cardiovascular:  Negative.   Gastrointestinal: Negative.   Endocrine: Negative.   Genitourinary: Negative.   Musculoskeletal: Negative.   Skin: Negative.   Allergic/Immunologic: Negative.   Neurological: Negative.   Hematological: Negative.   Psychiatric/Behavioral: Negative.        Crys a lot       Objective:   Physical Exam  Constitutional: She is oriented to person, place, and time. She appears distressed.  Elderly and somewhat frail but unchanged in appearance for several years other than more kyphotic and more diminished hearing.    HENT:  Head: Normocephalic and atraumatic.  Right Ear: External ear normal.  Left Ear: External ear normal.  Nose: Nose normal.  Mouth/Throat: Oropharynx is clear and moist. No oropharyngeal exudate.  Eyes: Conjunctivae and EOM are normal. Pupils are equal, round, and reactive to light. Right eye exhibits no discharge. Left eye exhibits no discharge. No scleral icterus.  Neck: Normal range of motion. Neck supple. No thyromegaly present.  No bruits thyromegaly or anterior cervical adenopathy  Cardiovascular: Normal rate, regular rhythm, normal heart sounds and intact distal pulses.   No murmur heard. Pulmonary/Chest: Effort normal and breath sounds normal. No respiratory distress. She has no wheezes. She has no rales.  Abdominal: Soft. Bowel sounds are normal. She exhibits no mass. There is no tenderness. There is no rebound and no guarding.  No abdominal tenderness masses or organ enlargement  Musculoskeletal: Normal range of motion. She exhibits no edema.  Lymphadenopathy:    She has no cervical adenopathy.  Neurological: She is alert and oriented to person, place, and time. She has normal reflexes. No cranial nerve deficit.  Skin: Skin is warm and dry. No rash noted.  Psychiatric: Her behavior is normal. Judgment and thought content normal.  The patient's mood remains depressed. She says she is depressed. She has no appetite.  Nursing note and vitals reviewed.  BP 91/65 (BP Location: Left Arm)   Pulse 67   Temp 97 F (36.1 C) (Oral)   Ht 5' 1.25" (1.556 m)   Wt 101 lb (45.8 kg)   BMI 18.93 kg/m         Assessment & Plan:  1. Essential hypertension -The blood pressure is running well at home with readings in the 120/70 range consistently. - BMP8+EGFR - CBC with Differential/Platelet - Hepatic function panel  2. Pure hypercholesterolemia The patient will continue with her current diet habits and omega-3 fatty acids. - CBC with Differential/Platelet - Lipid panel  3.  Vitamin D deficiency -Continue current treatment pending results of lab work - CBC with Differential/Platelet - VITAMIN D 25 Hydroxy (Vit-D Deficiency, Fractures)  4. Memory deficit -Continue with Aricept and Namenda - CBC with Differential/Platelet  5. Senile purpura (HCC) - CBC with Differential/Platelet  6. Thyroid cancer ([place]) -Continue with current thyroid treatment pending results of lab work - CBC with Differential/Platelet  7. Severe single current episode of major depressive disorder, without psychotic features ([place]) -Start Lexapro 5 mg nightly  Patient Instructions                       Medicare Annual Wellness Visit  [facility] and the medical providers at [facility] strive to bring you the best medical care.  In doing so we not only want to address your current medical conditions and concerns but also to detect new conditions early and prevent illness, disease and health-related problems.    Medicare offers a yearly Wellness Visit which allows our  clinical staff to assess your need for preventative services including immunizations, lifestyle education, counseling to decrease risk of preventable diseases and screening for fall risk and other medical concerns.    This visit is provided free of charge (no copay) for all Medicare recipients. The clinical pharmacists at [facility] have begun to conduct these Wellness Visits which will also include a thorough review of all your medications.    As you primary medical provider recommend that you make an appointment for your Annual Wellness Visit if you have not done so already this year.  You may set up this appointment before you leave today or you may call back ([phone]) and schedule an appointment.  Please make sure when you call that you mention that you are scheduling your Annual Wellness Visit with the clinical pharmacist so that the appointment may be made for the proper length  of time.     Continue current medications. Continue good therapeutic lifestyle changes which include good diet and exercise. Fall precautions discussed with patient. If an FOBT was given today- please return it to our front desk. If you are over 24 years old - you may need Prevnar 68 or the adult Pneumonia vaccine.  **Flu shots are available--- please call and schedule a FLU-CLINIC appointment**  After your visit with [place] today you will receive a survey in the mail or online from [employer] regarding your care with [place]. Please take a moment to fill this out. Your feedback is very important to [place] as you can help [place] better understand your patient needs as well as improve your experience and satisfaction. WE CARE ABOUT YOU!!!   Take the antidepressant once daily at night and call us in about 4 weeks and let us know if it is helping any or not.  Arrie Senate MD

## 2016-02-17 NOTE — Addendum Note (Signed)
Addended by: Zannie Cove on: 02/17/2016 03:41 PM   Modules accepted: Orders

## 2016-02-18 LAB — CBC WITH DIFFERENTIAL/PLATELET
BASOS: 1 %
Basophils Absolute: 0.1 10*3/uL (ref 0.0–0.2)
EOS (ABSOLUTE): 0.1 10*3/uL (ref 0.0–0.4)
EOS: 2 %
HEMATOCRIT: 39.4 % (ref 34.0–46.6)
Hemoglobin: 13.3 g/dL (ref 11.1–15.9)
Immature Grans (Abs): 0 10*3/uL (ref 0.0–0.1)
Immature Granulocytes: 0 %
LYMPHS ABS: 2.1 10*3/uL (ref 0.7–3.1)
Lymphs: 27 %
MCH: 29.8 pg (ref 26.6–33.0)
MCHC: 33.8 g/dL (ref 31.5–35.7)
MCV: 88 fL (ref 79–97)
MONOS ABS: 0.8 10*3/uL (ref 0.1–0.9)
Monocytes: 11 %
NEUTROS ABS: 4.5 10*3/uL (ref 1.4–7.0)
Neutrophils: 59 %
Platelets: 216 10*3/uL (ref 150–379)
RBC: 4.46 x10E6/uL (ref 3.77–5.28)
RDW: 14.2 % (ref 12.3–15.4)
WBC: 7.6 10*3/uL (ref 3.4–10.8)

## 2016-02-18 LAB — VITAMIN D 25 HYDROXY (VIT D DEFICIENCY, FRACTURES): Vit D, 25-Hydroxy: 93.7 ng/mL (ref 30.0–100.0)

## 2016-02-18 LAB — HEPATIC FUNCTION PANEL
ALT: 11 IU/L (ref 0–32)
AST: 20 IU/L (ref 0–40)
Albumin: 4.2 g/dL (ref 3.2–4.6)
Alkaline Phosphatase: 38 IU/L — ABNORMAL LOW (ref 39–117)
Bilirubin Total: <0.2 mg/dL (ref 0.0–1.2)
Bilirubin, Direct: 0.04 mg/dL (ref 0.00–0.40)
Total Protein: 6.8 g/dL (ref 6.0–8.5)

## 2016-02-18 LAB — BMP8+EGFR
BUN / CREAT RATIO: 19 (ref 12–28)
BUN: 19 mg/dL (ref 10–36)
CO2: 27 mmol/L (ref 18–29)
CREATININE: 1.01 mg/dL — AB (ref 0.57–1.00)
Calcium: 9.5 mg/dL (ref 8.7–10.3)
Chloride: 105 mmol/L (ref 96–106)
GFR, EST AFRICAN AMERICAN: 55 mL/min/{1.73_m2} — AB (ref >59)
GFR, EST NON AFRICAN AMERICAN: 48 mL/min/{1.73_m2} — AB (ref >59)
GLUCOSE: 79 mg/dL (ref 65–99)
Potassium: 4.3 mmol/L (ref 3.5–5.2)
SODIUM: 148 mmol/L — AB (ref 134–144)

## 2016-02-18 LAB — LIPID PANEL
Chol/HDL Ratio: 2.6 ratio units (ref 0.0–4.4)
Cholesterol, Total: 205 mg/dL — ABNORMAL HIGH (ref 100–199)
HDL: 80 mg/dL (ref >39)
LDL Calculated: 107 mg/dL — ABNORMAL HIGH (ref 0–99)
Triglycerides: 92 mg/dL (ref 0–149)
VLDL Cholesterol Cal: 18 mg/dL (ref 5–40)

## 2016-03-22 ENCOUNTER — Telehealth: Payer: Self-pay | Admitting: Family Medicine

## 2016-03-22 MED ORDER — ESCITALOPRAM OXALATE 10 MG PO TABS: 10.0000 mg | ORAL_TABLET | Freq: Every day | ORAL | 11 refills | Status: DC

## 2016-03-22 NOTE — Telephone Encounter (Signed)
Go ahead and try increasing the Lexapro to 10 mg. You may want to get her to the 10 mg gradually by taking 10 mg on Monday Wednesday and Friday and 5 on the other days for a couple weeks and then go up to 10 mg daily

## 2016-03-22 NOTE — Telephone Encounter (Signed)
Evelyn aware

## 2016-04-01 ENCOUNTER — Other Ambulatory Visit: Payer: Self-pay | Admitting: Family Medicine

## 2016-04-18 ENCOUNTER — Other Ambulatory Visit: Payer: Self-pay | Admitting: *Deleted

## 2016-04-18 MED ORDER — ESCITALOPRAM OXALATE 10 MG PO TABS: 10.0000 mg | ORAL_TABLET | Freq: Every day | ORAL | 3 refills | Status: DC

## 2016-04-18 MED ORDER — AMLODIPINE BESYLATE 5 MG PO TABS: 2.5000 mg | ORAL_TABLET | Freq: Every day | ORAL | 3 refills | Status: DC

## 2016-05-16 ENCOUNTER — Other Ambulatory Visit: Payer: Self-pay | Admitting: Family Medicine

## 2016-05-22 DIAGNOSIS — H401122 Primary open-angle glaucoma, left eye, moderate stage: Secondary | ICD-10-CM | POA: Diagnosis not present

## 2016-05-22 DIAGNOSIS — Z961 Presence of intraocular lens: Secondary | ICD-10-CM | POA: Diagnosis not present

## 2016-05-22 DIAGNOSIS — H401113 Primary open-angle glaucoma, right eye, severe stage: Secondary | ICD-10-CM | POA: Diagnosis not present

## 2016-05-22 DIAGNOSIS — H353131 Nonexudative age-related macular degeneration, bilateral, early dry stage: Secondary | ICD-10-CM | POA: Diagnosis not present

## 2016-06-22 ENCOUNTER — Ambulatory Visit (INDEPENDENT_AMBULATORY_CARE_PROVIDER_SITE_OTHER): Payer: Medicare Other | Admitting: Family Medicine

## 2016-06-22 ENCOUNTER — Encounter: Payer: Self-pay | Admitting: Family Medicine

## 2016-06-22 VITALS — BP 96/54 | HR 74 | Temp 96.3°F | Ht 61.3 in | Wt 103.0 lb

## 2016-06-22 DIAGNOSIS — E559 Vitamin D deficiency, unspecified: Secondary | ICD-10-CM | POA: Diagnosis not present

## 2016-06-22 DIAGNOSIS — I10 Essential (primary) hypertension: Secondary | ICD-10-CM

## 2016-06-22 DIAGNOSIS — C73 Malignant neoplasm of thyroid gland: Secondary | ICD-10-CM | POA: Diagnosis not present

## 2016-06-22 DIAGNOSIS — E7800 Pure hypercholesterolemia, unspecified: Secondary | ICD-10-CM | POA: Diagnosis not present

## 2016-06-22 DIAGNOSIS — R413 Other amnesia: Secondary | ICD-10-CM | POA: Diagnosis not present

## 2016-06-22 DIAGNOSIS — D692 Other nonthrombocytopenic purpura: Secondary | ICD-10-CM | POA: Diagnosis not present

## 2016-06-22 MED ORDER — MEMANTINE HCL ER 28 MG PO CP24: ORAL_CAPSULE | ORAL | 3 refills | Status: DC

## 2016-06-22 NOTE — Progress Notes (Signed)
Subjective:    Patient ID: Bianca Thompson, female    DOB: 1920/05/03, 83 y.o.   MRN: [id number]  HPI Pt here for follow up and management of chronic medical problems which includes hyperlipidemia and hypertension. She is taking medication regularly.The patient appears to have been doing better with the increased dose of Lexapro. She comes to the visit today with her daughter-in-law. The son and daughter-in-law have been very purposefully doing a good job of taking care of [place]. She is requesting a refill on the Namenda and will get some routine lab work including a thyroid panel today. She will also asked to return an FOBT. Her weight today is 103 and this is up a couple pounds since the last visit. The patient today is in very good spirits. She has a positive attitude she was laughing and smiling ask the cutting up in the office.    Patient Active Problem List   Diagnosis Date Noted  . Senile purpura ([place]) 07/08/2014  . Thyroid cancer ([place]) 08/22/2012  . Hyperlipidemia 08/22/2012  . Hypertension 08/22/2012  . Hyperthyroidism 08/22/2012  . Osteoporosis 08/22/2012  . Memory deficit 08/22/2012  . Degenerative arthritis of the lumbar spine 08/22/2012   Outpatient Encounter Prescriptions as of 06/22/2016  Medication Sig  . amLODipine (NORVASC) 5 MG tablet Take 0.5 tablets (2.5 mg total) by mouth daily.  . Ascorbic Acid (VITAMIN C) 500 MG tablet Take 500 mg by mouth daily.    Marland Kitchen aspirin (SB LOW DOSE ASA EC) 81 MG EC tablet Take 81 mg by mouth daily.    . calcium-vitamin D (OSCAL WITH D) 500-200 MG-UNIT per tablet Take 1 tablet by mouth 2 (two) times daily.  . Cholecalciferol (VITAMIN D) 2000 UNITS CAPS Take by mouth. Take 2 tablets by mouth Saturday and Sunday . Then on Monday -Friday take one tablet by mouth   . docusate sodium (COLACE) 100 MG capsule Take 100 mg by mouth daily.  Marland Kitchen donepezil (ARICEPT) 10 MG tablet TAKE ONE TABLET BY MOUTH ONCE DAILY  . dorzolamide-timolol (COSOPT) 22.3-6.8  MG/ML ophthalmic solution   . escitalopram (LEXAPRO) 10 MG tablet Take 1 tablet (10 mg total) by mouth daily.  . feeding supplement, ENSURE COMPLETE, (ENSURE COMPLETE) LIQD Take 237 mLs by mouth daily.  . Garlic 8016 MG CAPS Take 1,000 capsules by mouth daily.  Marland Kitchen latanoprost (XALATAN) 0.005 % ophthalmic solution   . levothyroxine (SYNTHROID, LEVOTHROID) 112 MCG tablet TAKE ONE TABLET BY MOUTH ONCE DAILY  . Memantine HCl ER (NAMENDA XR) 28 MG CP24 TAKE ONE CAPSULE BY MOUTH ONCE DAILY AT BEDTIME  . Omega-3 Fatty Acids (FISH OIL) 1200 MG CAPS Take 2 capsules by mouth daily. Take 2-3 tablets by mouth daily  . quinapril (ACCUPRIL) 40 MG tablet TAKE ONE TABLET BY MOUTH ONCE DAILY  . raloxifene (EVISTA) 60 MG tablet TAKE ONE TABLET BY MOUTH ONCE DAILY  . [DISCONTINUED] NAMENDA XR 28 MG CP24 24 hr capsule TAKE ONE CAPSULE BY MOUTH AT BEDTIME   Facility-Administered Encounter Medications as of 06/22/2016  Medication  . cyanocobalamin ((VITAMIN B-12)) injection 1,000 mcg      Review of Systems  Constitutional: Negative.   HENT: Negative.   Eyes: Negative.   Respiratory: Negative.   Cardiovascular: Negative.   Gastrointestinal: Negative.   Endocrine: Negative.   Genitourinary: Negative.   Musculoskeletal: Negative.   Skin: Negative.   Allergic/Immunologic: Negative.   Neurological: Negative.   Hematological: Negative.   Psychiatric/Behavioral: Negative.  Objective:   Physical Exam  Constitutional: She is oriented to person, place, and time. She appears well-nourished. No distress.  The patient is elderly but looks good for her age of 7 years.  HENT:  Head: Normocephalic and atraumatic.  Right Ear: External ear normal.  Left Ear: External ear normal.  Nose: Nose normal.  Mouth/Throat: Oropharynx is clear and moist. No oropharyngeal exudate.  Eyes: Conjunctivae and EOM are normal. Pupils are equal, round, and reactive to light. Right eye exhibits no discharge. Left eye exhibits  no discharge. No scleral icterus.  Neck: Normal range of motion. Neck supple. No thyromegaly present.  No thyromegaly bruits or adenopathy  Cardiovascular: Normal rate, regular rhythm and normal heart sounds.   No murmur heard. The heart was regular today at 72/m  Pulmonary/Chest: Effort normal and breath sounds normal. No respiratory distress. She has no wheezes. She has no rales.  Clear anteriorly and posteriorly  Abdominal: Soft. Bowel sounds are normal. She exhibits no mass. There is no tenderness. There is no rebound and no guarding.  No abdominal tenderness masses or bruits or organ enlargement  Musculoskeletal: Normal range of motion. She exhibits no edema.  Patient has a somewhat unstable gait but seems to be doing well using her cane and with assistance  Lymphadenopathy:    She has no cervical adenopathy.  Neurological: She is alert and oriented to person, place, and time. She has normal reflexes. No cranial nerve deficit.  Neurologically the patient seemed to be much more alert today and remembered one of my relatives that she went to school with.  Skin: Skin is warm and dry. No rash noted.  Psychiatric: She has a normal mood and affect. Her behavior is normal. Judgment and thought content normal.  Nursing note and vitals reviewed.  BP (!) 96/54 (BP Location: Left Arm)   Pulse 74   Temp (!) 96.3 F (35.7 C) (Oral)   Ht 5' 1.3" (1.557 m)   Wt 103 lb (46.7 kg)   BMI 19.27 kg/m         Assessment & Plan:  1. Essential hypertension -The blood pressure is somewhat low today, lower than usual. The patient's daughter-in-law will check readings at home and will let us know if it is consistently running this low and we may consider discontinuing some of the medicine at that time. She will call us in 2-3 weeks with those readings. - CBC with Differential/Platelet - BMP8+EGFR - Hepatic function panel  2. Pure hypercholesterolemia -She will continue with her omega-3 fatty acids  pending results of lab work - CBC with Differential/Platelet - Lipid panel  3. Vitamin D deficiency -Continue with vitamin D replacement and if it's still running in the 90s we may reduce this slightly so that it is down closer to the 50-60 range. - CBC with Differential/Platelet - VITAMIN D 25 Hydroxy (Vit-D Deficiency, Fractures)  4. Memory deficit -She will continue with Namenda and Aricept and as mentioned earlier she is doing very well with her memory today. - CBC with Differential/Platelet  5. Senile purpura (HCC) -No purpura noted today. - CBC with Differential/Platelet  6. Thyroid cancer ([place]) - Thyroid Panel With TSH  Meds ordered this encounter  Medications  . memantine (NAMENDA XR) 28 MG CP24 24 hr capsule    Sig: TAKE ONE CAPSULE BY MOUTH ONCE DAILY AT BEDTIME    Dispense:  90 capsule    Refill:  3   Patient Instructions  Medicare Annual Wellness Visit  [facility] and the medical providers at [facility] strive to bring you the best medical care.  In doing so we not only want to address your current medical conditions and concerns but also to detect new conditions early and prevent illness, disease and health-related problems.    Medicare offers a yearly Wellness Visit which allows our clinical staff to assess your need for preventative services including immunizations, lifestyle education, counseling to decrease risk of preventable diseases and screening for fall risk and other medical concerns.    This visit is provided free of charge (no copay) for all Medicare recipients. The clinical pharmacists at [facility] have begun to conduct these Wellness Visits which will also include a thorough review of all your medications.    As you primary medical provider recommend that you make an appointment for your Annual Wellness Visit if you have not done so already this year.  You may set up this appointment  before you leave today or you may call back ([phone]) and schedule an appointment.  Please make sure when you call that you mention that you are scheduling your Annual Wellness Visit with the clinical pharmacist so that the appointment may be made for the proper length of time.     Continue current medications. Continue good therapeutic lifestyle changes which include good diet and exercise. Fall precautions discussed with patient. If an FOBT was given today- please return it to our front desk. If you are over 106 years old - you may need Prevnar 17 or the adult Pneumonia vaccine.  **Flu shots are available--- please call and schedule a FLU-CLINIC appointment**  After your visit with [place] today you will receive a survey in the mail or online from [employer] regarding your care with [place]. Please take a moment to fill this out. Your feedback is very important to [place] as you can help [place] better understand your patient needs as well as improve your experience and satisfaction. WE CARE ABOUT YOU!!!  Stay active Continue to be careful do not put yourself at risk for falling Drink some ensure during the week to keep her weight up Always use your cane or a walker Check blood pressures at home and if they consistently run at home less than 100 over the 50s we may consider reducing the blood pressure medicine some more. Please call my nurse with blood pressure readings in 2-3 weeks.   Arrie Senate MD

## 2016-06-22 NOTE — Patient Instructions (Addendum)
Medicare Annual Wellness Visit  [facility] and the medical providers at [facility] strive to bring you the best medical care.  In doing so we not only want to address your current medical conditions and concerns but also to detect new conditions early and prevent illness, disease and health-related problems.    Medicare offers a yearly Wellness Visit which allows our clinical staff to assess your need for preventative services including immunizations, lifestyle education, counseling to decrease risk of preventable diseases and screening for fall risk and other medical concerns.    This visit is provided free of charge (no copay) for all Medicare recipients. The clinical pharmacists at [facility] have begun to conduct these Wellness Visits which will also include a thorough review of all your medications.    As you primary medical provider recommend that you make an appointment for your Annual Wellness Visit if you have not done so already this year.  You may set up this appointment before you leave today or you may call back ([phone]) and schedule an appointment.  Please make sure when you call that you mention that you are scheduling your Annual Wellness Visit with the clinical pharmacist so that the appointment may be made for the proper length of time.     Continue current medications. Continue good therapeutic lifestyle changes which include good diet and exercise. Fall precautions discussed with patient. If an FOBT was given today- please return it to our front desk. If you are over 47 years old - you may need Prevnar 40 or the adult Pneumonia vaccine.  **Flu shots are available--- please call and schedule a FLU-CLINIC appointment**  After your visit with [place] today you will receive a survey in the mail or online from [employer] regarding your care with [place]. Please take a moment to fill this out. Your feedback is very  important to [place] as you can help [place] better understand your patient needs as well as improve your experience and satisfaction. WE CARE ABOUT YOU!!!  Stay active Continue to be careful do not put yourself at risk for falling Drink some ensure during the week to keep her weight up Always use your cane or a walker Check blood pressures at home and if they consistently run at home less than 100 over the 50s we may consider reducing the blood pressure medicine some more. Please call my nurse with blood pressure readings in 2-3 weeks.

## 2016-06-23 LAB — CBC WITH DIFFERENTIAL/PLATELET
BASOS: 1 %
Basophils Absolute: 0 10*3/uL (ref 0.0–0.2)
EOS (ABSOLUTE): 0.2 10*3/uL (ref 0.0–0.4)
EOS: 2 %
HEMATOCRIT: 37.1 % (ref 34.0–46.6)
Hemoglobin: 12.4 g/dL (ref 11.1–15.9)
IMMATURE GRANS (ABS): 0 10*3/uL (ref 0.0–0.1)
IMMATURE GRANULOCYTES: 0 %
Lymphocytes Absolute: 2.4 10*3/uL (ref 0.7–3.1)
Lymphs: 32 %
MCH: 30.5 pg (ref 26.6–33.0)
MCHC: 33.4 g/dL (ref 31.5–35.7)
MCV: 91 fL (ref 79–97)
MONOCYTES: 10 %
MONOS ABS: 0.8 10*3/uL (ref 0.1–0.9)
Neutrophils Absolute: 4.2 10*3/uL (ref 1.4–7.0)
Neutrophils: 55 %
PLATELETS: 183 10*3/uL (ref 150–379)
RBC: 4.06 x10E6/uL (ref 3.77–5.28)
RDW: 14.6 % (ref 12.3–15.4)
WBC: 7.6 10*3/uL (ref 3.4–10.8)

## 2016-06-23 LAB — THYROID PANEL WITH TSH
Free Thyroxine Index: 3.3 (ref 1.2–4.9)
T3 Uptake Ratio: 32 % (ref 24–39)
T4, Total: 10.2 ug/dL (ref 4.5–12.0)
TSH: 0.019 u[IU]/mL — ABNORMAL LOW (ref 0.450–4.500)

## 2016-06-23 LAB — BMP8+EGFR
BUN/Creatinine Ratio: 26 (ref 12–28)
BUN: 31 mg/dL (ref 10–36)
CALCIUM: 9.1 mg/dL (ref 8.7–10.3)
CHLORIDE: 103 mmol/L (ref 96–106)
CO2: 28 mmol/L (ref 18–29)
Creatinine, Ser: 1.18 mg/dL — ABNORMAL HIGH (ref 0.57–1.00)
GFR calc Af Amer: 46 mL/min/{1.73_m2} — ABNORMAL LOW (ref >59)
GFR, EST NON AFRICAN AMERICAN: 40 mL/min/{1.73_m2} — AB (ref >59)
Glucose: 96 mg/dL (ref 65–99)
POTASSIUM: 4.1 mmol/L (ref 3.5–5.2)
Sodium: 144 mmol/L (ref 134–144)

## 2016-06-23 LAB — LIPID PANEL
CHOL/HDL RATIO: 2.5 ratio (ref 0.0–4.4)
Cholesterol, Total: 176 mg/dL (ref 100–199)
HDL: 70 mg/dL (ref >39)
LDL CALC: 84 mg/dL (ref 0–99)
TRIGLYCERIDES: 109 mg/dL (ref 0–149)
VLDL CHOLESTEROL CAL: 22 mg/dL (ref 5–40)

## 2016-06-23 LAB — HEPATIC FUNCTION PANEL
ALBUMIN: 3.9 g/dL (ref 3.2–4.6)
ALT: 8 IU/L (ref 0–32)
AST: 17 IU/L (ref 0–40)
Alkaline Phosphatase: 31 IU/L — ABNORMAL LOW (ref 39–117)
BILIRUBIN, DIRECT: 0.05 mg/dL (ref 0.00–0.40)
Total Protein: 6.3 g/dL (ref 6.0–8.5)

## 2016-06-23 LAB — VITAMIN D 25 HYDROXY (VIT D DEFICIENCY, FRACTURES): Vit D, 25-Hydroxy: 80.7 ng/mL (ref 30.0–100.0)

## 2016-06-25 ENCOUNTER — Encounter: Payer: Self-pay | Admitting: Family Medicine

## 2016-06-26 ENCOUNTER — Telehealth: Payer: Self-pay | Admitting: Family Medicine

## 2016-06-26 NOTE — Telephone Encounter (Signed)
Today  76/50.  Taking quinapril 40 mg - 1/2 a day And Amlodipine 5 mg - 1/2 a day Already had both this morning  What can we cut back on ?? Please address for [facility] coverage  Aware to take it easy today and to push water  Please call - [name]

## 2016-06-26 NOTE — Telephone Encounter (Signed)
Hold amlodipine foe several days and keep diary of blood pressures- need to be seen next week

## 2016-06-26 NOTE — Telephone Encounter (Signed)
Bianca Thompson aware and she will call back for appt

## 2016-07-03 ENCOUNTER — Telehealth: Payer: Self-pay | Admitting: *Deleted

## 2016-07-03 NOTE — Telephone Encounter (Signed)
Incoming call from pt's daughter regarding pt's BP BP reading today was 88/48 Pt has has hypotensive readings since last OV Pt is not having any dizziness or SOB Pt states she feels fine Please advise

## 2016-07-03 NOTE — Telephone Encounter (Signed)
Please reduce amlodipine to 2-1/2 mg on Monday Wednesday and Friday only. Continue to monitor blood pressures and let us know the readings in 3-4 weeks, continue with quinapril as doing

## 2016-07-04 ENCOUNTER — Encounter: Payer: Self-pay | Admitting: Nurse Practitioner

## 2016-07-04 ENCOUNTER — Telehealth: Payer: Self-pay | Admitting: Family Medicine

## 2016-07-04 ENCOUNTER — Ambulatory Visit (INDEPENDENT_AMBULATORY_CARE_PROVIDER_SITE_OTHER): Payer: Medicare Other | Admitting: Nurse Practitioner

## 2016-07-04 VITALS — BP 134/67 | HR 55 | Temp 96.8°F | Ht 61.0 in | Wt 104.0 lb

## 2016-07-04 DIAGNOSIS — I10 Essential (primary) hypertension: Secondary | ICD-10-CM

## 2016-07-04 NOTE — Progress Notes (Signed)
   Subjective:    Patient ID: Bianca Thompson, female    DOB: 1920-07-16, 83 y.o.   MRN: [id number]  HPI Patient comes in today for recheck of blood pressure. Her blood pressure has been gradually dropping over the last several months- Dr. Laurance Flatten had cut her quinapril in half several months ago but blood pressure had continued to stay low. Daughter called on 06/26/16 saying that blood pressure has continued to stay in the 16'X systolic and she was told to hold amlodipine and keep diary of blood pressures. Blood pressures now are in the 450'T systolic most of the time. Her daughter in law says when patient sits outside blood pressure seems to drop. [facility] also say patient feels the same all the time wether blood pressure is low or not.    Review of Systems  Constitutional: Negative.   HENT: Negative.   Respiratory: Negative.   Cardiovascular: Negative.   Gastrointestinal: Negative.   Genitourinary: Negative.   Neurological: Negative.   Psychiatric/Behavioral: Negative.   All other systems reviewed and are negative.      Objective:   Physical Exam  Constitutional: She is oriented to person, place, and time. She appears well-developed.  Cardiovascular: Normal rate and regular rhythm.   Murmur (2/6 systiolic murmur) heard. Neurological: She is alert and oriented to person, place, and time.  Skin: Skin is warm.  Psychiatric: She has a normal mood and affect. Her behavior is normal. Judgment and thought content normal.   BP 134/67   Pulse (!) 55   Temp (!) 96.8 F (36 C) (Oral)   Ht 5\' 1"  (1.549 m)   Wt 104 lb (47.2 kg)   BMI 19.65 kg/m      Assessment & Plan:  1. Essential hypertension Continue current dose of quinapril Increase fluid consumption Continue to keep diary of blood pressure Folow up with DR. Mayra Neer, FNP

## 2016-07-04 NOTE — Telephone Encounter (Signed)
Notified per MMM generic should be fine

## 2016-07-05 ENCOUNTER — Telehealth: Payer: Self-pay | Admitting: Family Medicine

## 2016-07-05 NOTE — Telephone Encounter (Signed)
Per MMM, Amlodipine is being held Pt's daughter notified not to restart Amlodipine at this time Confirmed with [facility]

## 2016-07-05 NOTE — Telephone Encounter (Signed)
called Bianca Thompson - left a detailed message - she will call back with any questions

## 2016-07-15 ENCOUNTER — Other Ambulatory Visit: Payer: Self-pay | Admitting: Family Medicine

## 2016-08-16 ENCOUNTER — Other Ambulatory Visit: Payer: Self-pay | Admitting: Family Medicine

## 2016-10-12 IMAGING — CR DG CHEST 2V
2 series · 2 of 2 positions shown · non-contrast
Comparison: 06/04/2013

CLINICAL DATA: Hypertension.  Hyperlipidemia.

EXAM:
CHEST  2 VIEW

[view not recorded (1 of 2)]
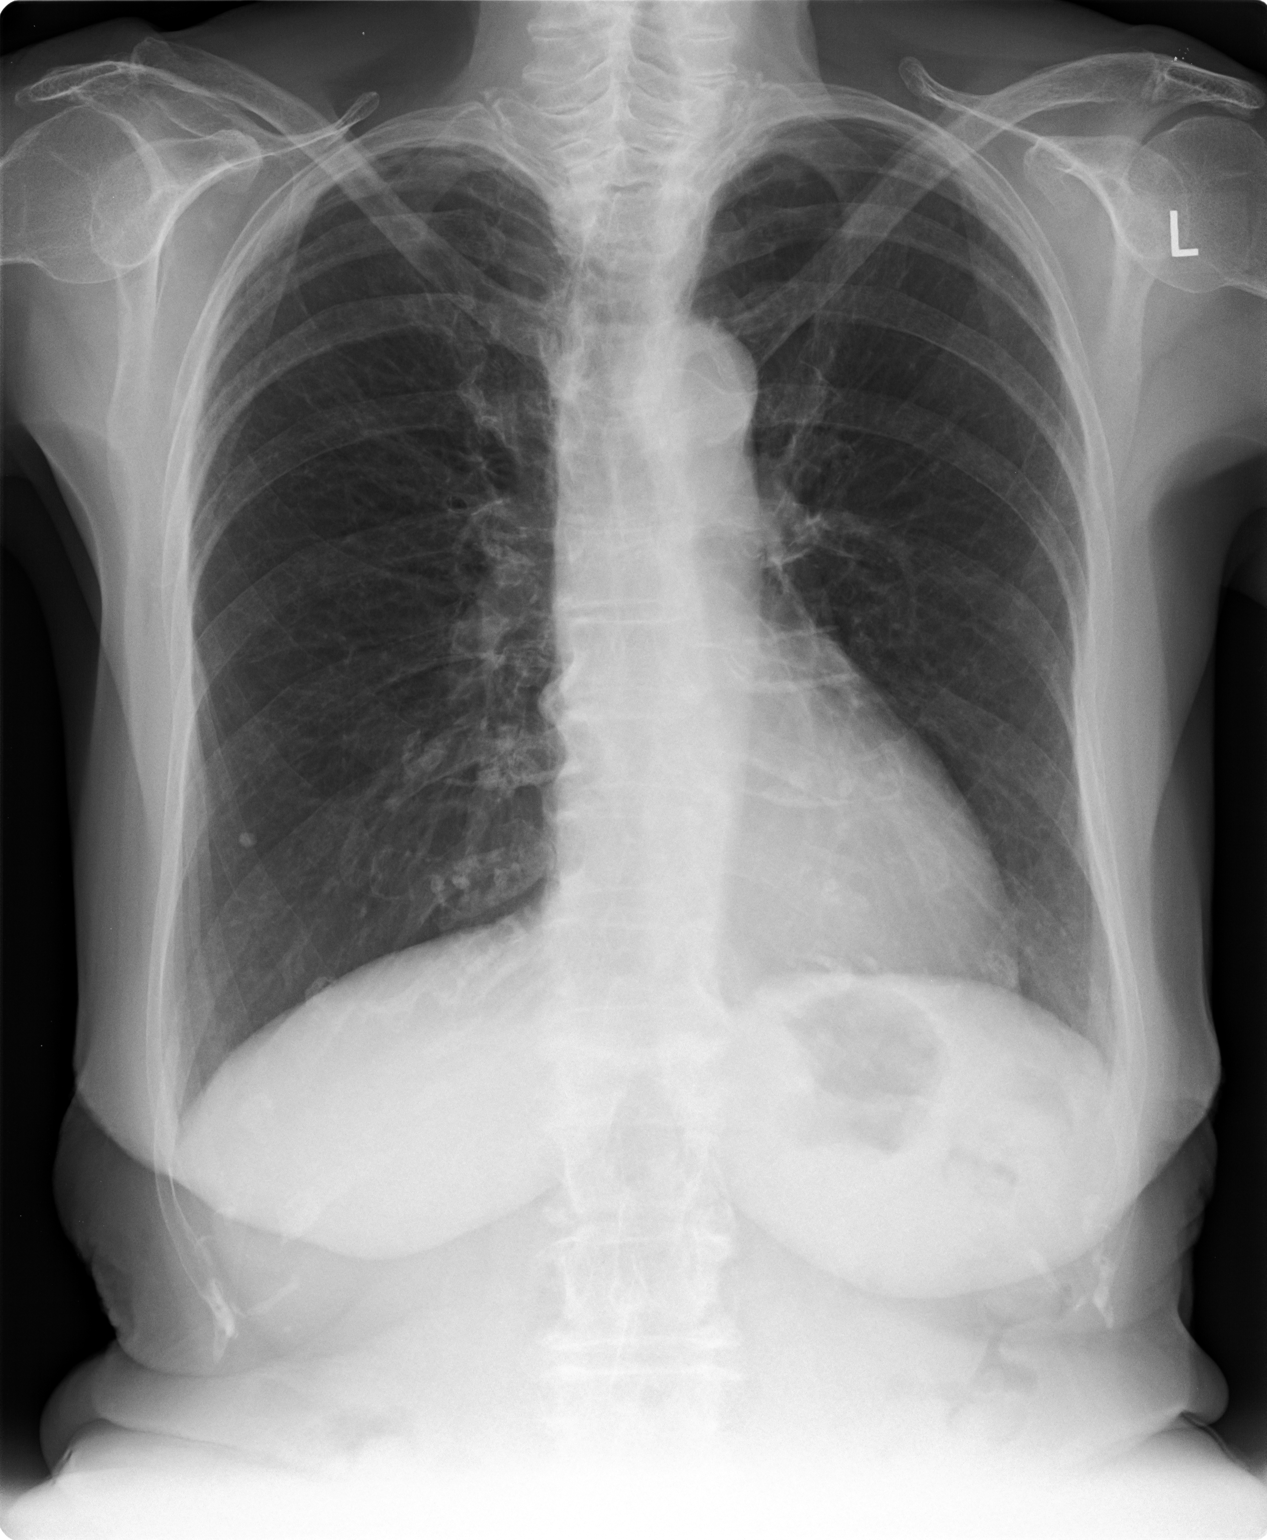

[view not recorded (2 of 2)]
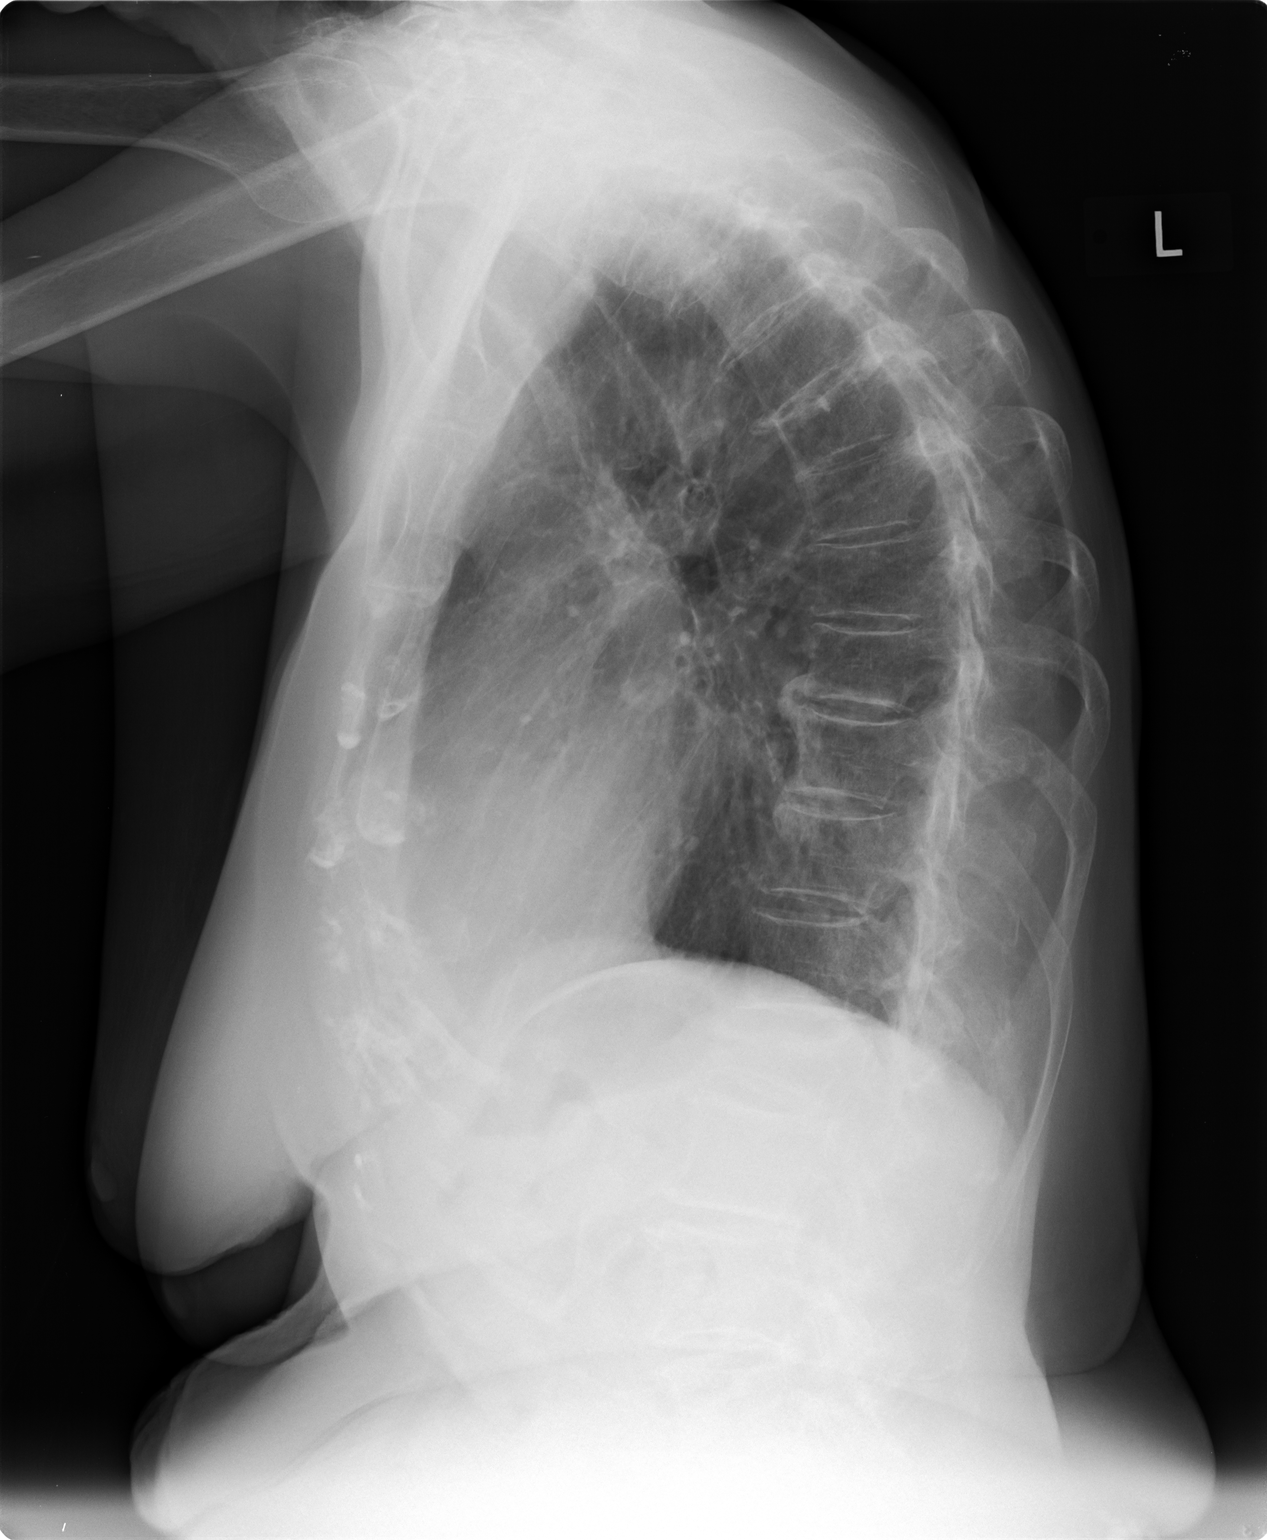

[2 of 2 positions shown; findings below may reference images not displayed]

FINDINGS: Cardiac silhouette is normal in size and configuration. Normal
mediastinal and hilar contours. Small stable granuloma at the right
lung base. Lungs otherwise clear. No pleural effusion or
pneumothorax.

Bony thorax demineralized. Slight wedge-shaped compression deformity
of a mid to upper thoracic vertebra is stable. Mild to moderate
compression fracture at the thoracolumbar junction is new from the
prior exam.
IMPRESSION: 1. No acute cardiopulmonary disease.
2. Compression fracture at the thoracolumbar junction, new from the
prior chest radiograph, but of unclear chronicity. This could be
recent this patient has back pain.

## 2016-12-11 ENCOUNTER — Telehealth: Payer: Self-pay | Admitting: Family Medicine

## 2016-12-11 ENCOUNTER — Other Ambulatory Visit: Payer: Self-pay | Admitting: *Deleted

## 2016-12-11 MED ORDER — POLYMYXIN B-TRIMETHOPRIM 10000-0.1 UNIT/ML-% OP SOLN: 1.0000 [drp] | Freq: Four times a day (QID) | OPHTHALMIC | 0 refills | Status: DC

## 2016-12-11 NOTE — Telephone Encounter (Signed)
Polytrim ophthalmic drops, 1-2 drops to the affected eye or eyes 4 times daily for 7-10 days

## 2016-12-11 NOTE — Telephone Encounter (Signed)
Pt aware.

## 2016-12-11 NOTE — Telephone Encounter (Signed)
Please advise on medicine for patient's eye.

## 2016-12-11 NOTE — Telephone Encounter (Signed)
What symptoms do you have? Left eye red and matted. Feels like it has sand in it  How long have you been sick? Today  Have you been seen for this problem? No  If your provider decides to give you a prescription, which pharmacy would you like for it to be sent to? [facility] in [place]   Patient informed that this information will be sent to the clinical staff for review and that they should receive a follow up call.

## 2016-12-22 ENCOUNTER — Encounter: Payer: Self-pay | Admitting: Family Medicine

## 2016-12-22 ENCOUNTER — Ambulatory Visit (INDEPENDENT_AMBULATORY_CARE_PROVIDER_SITE_OTHER): Payer: Medicare Other | Admitting: Family Medicine

## 2016-12-22 VITALS — BP 124/66 | HR 49 | Temp 96.6°F | Ht 61.0 in | Wt 102.0 lb

## 2016-12-22 DIAGNOSIS — E559 Vitamin D deficiency, unspecified: Secondary | ICD-10-CM | POA: Diagnosis not present

## 2016-12-22 DIAGNOSIS — E7800 Pure hypercholesterolemia, unspecified: Secondary | ICD-10-CM

## 2016-12-22 DIAGNOSIS — R54 Age-related physical debility: Secondary | ICD-10-CM

## 2016-12-22 DIAGNOSIS — D692 Other nonthrombocytopenic purpura: Secondary | ICD-10-CM

## 2016-12-22 DIAGNOSIS — R413 Other amnesia: Secondary | ICD-10-CM

## 2016-12-22 DIAGNOSIS — Z23 Encounter for immunization: Secondary | ICD-10-CM

## 2016-12-22 DIAGNOSIS — I10 Essential (primary) hypertension: Secondary | ICD-10-CM

## 2016-12-22 DIAGNOSIS — C73 Malignant neoplasm of thyroid gland: Secondary | ICD-10-CM

## 2016-12-22 MED ORDER — DONEPEZIL HCL 10 MG PO TABS: 10.0000 mg | ORAL_TABLET | Freq: Every day | ORAL | 3 refills | Status: AC

## 2016-12-22 MED ORDER — RALOXIFENE HCL 60 MG PO TABS: 60.0000 mg | ORAL_TABLET | Freq: Every day | ORAL | 3 refills | Status: AC

## 2016-12-22 MED ORDER — QUINAPRIL HCL 40 MG PO TABS: 40.0000 mg | ORAL_TABLET | Freq: Every day | ORAL | 3 refills | Status: AC

## 2016-12-22 MED ORDER — LEVOTHYROXINE SODIUM 112 MCG PO TABS: 112.0000 ug | ORAL_TABLET | Freq: Every day | ORAL | 3 refills | Status: AC

## 2016-12-22 NOTE — Progress Notes (Signed)
Subjective:    Patient ID: Bianca Thompson, female    DOB: [birth year], 83 y.o.   MRN: [id number]  HPI Pt here for follow up and management of chronic medical problems which includes hyperlipidemia and hypertension. She is taking medication regularly.  Patient comes to the visit today with her daughter-in-law.  She has had a recent bout with pinkeye and has become incontinent with stool and bladder.  She will be given an FOBT to return and will get lab work including a thyroid today.  Blood pressure is doing better at home.  Overall the patient looks good and her daughter-in-law has close and ongoing records with her from day today with blood pressure readings and issues that occur at home.  The patient is currently taking her all baths and showers eating her food in the microwave and having some occasional rectal and bladder incontinence.  She denies any chest pain or shortness of breath.  She denies any trouble with nausea vomiting blood in the stool or black tarry bowel movements.  She does have rare fecal incontinence and bladder incontinence.  Her weight is down a couple pounds from previous visit.     Patient Active Problem List   Diagnosis Date Noted  . Senile purpura ([place]) 07/08/2014  . Thyroid cancer ([place]) 08/22/2012  . Hyperlipidemia 08/22/2012  . Hypertension 08/22/2012  . Hyperthyroidism 08/22/2012  . Osteoporosis 08/22/2012  . Memory deficit 08/22/2012  . Degenerative arthritis of the lumbar spine 08/22/2012   Outpatient Encounter Prescriptions as of 12/22/2016  Medication Sig  . Ascorbic Acid (VITAMIN C) 500 MG tablet Take 500 mg by mouth daily.    Marland Kitchen aspirin (SB LOW DOSE ASA EC) 81 MG EC tablet Take 81 mg by mouth daily.    . calcium-vitamin D (OSCAL WITH D) 500-200 MG-UNIT per tablet Take 1 tablet by mouth 2 (two) times daily.  . Cholecalciferol (VITAMIN D) 2000 UNITS CAPS Take by mouth. Take 2 tablets by mouth Saturday and Sunday . Then on Monday -Friday take one tablet by  mouth   . docusate sodium (COLACE) 100 MG capsule Take 100 mg by mouth daily.  Marland Kitchen donepezil (ARICEPT) 10 MG tablet TAKE ONE TABLET BY MOUTH ONCE DAILY  . dorzolamide-timolol (COSOPT) 22.3-6.8 MG/ML ophthalmic solution   . escitalopram (LEXAPRO) 10 MG tablet Take 1 tablet (10 mg total) by mouth daily.  . feeding supplement, ENSURE COMPLETE, (ENSURE COMPLETE) LIQD Take 237 mLs by mouth daily.  . Garlic 2902 MG CAPS Take 1,000 capsules by mouth daily.  Marland Kitchen latanoprost (XALATAN) 0.005 % ophthalmic solution   . levothyroxine (SYNTHROID, LEVOTHROID) 112 MCG tablet TAKE ONE TABLET BY MOUTH ONCE DAILY  . memantine (NAMENDA XR) 28 MG CP24 24 hr capsule TAKE ONE CAPSULE BY MOUTH ONCE DAILY AT BEDTIME  . Omega-3 Fatty Acids (FISH OIL) 1200 MG CAPS Take 2 capsules by mouth daily. Take 2-3 tablets by mouth daily  . quinapril (ACCUPRIL) 40 MG tablet TAKE ONE TABLET BY MOUTH ONCE DAILY  . raloxifene (EVISTA) 60 MG tablet TAKE 1 TABLET BY MOUTH ONCE DAILY  . trimethoprim-polymyxin b (POLYTRIM) ophthalmic solution Place 1-2 drops into both eyes every 6 (six) hours.  . [DISCONTINUED] amLODipine (NORVASC) 5 MG tablet Take 0.5 tablets (2.5 mg total) by mouth daily. (Patient not taking: Reported on 07/04/2016)   Facility-Administered Encounter Medications as of 12/22/2016  Medication  . cyanocobalamin ((VITAMIN B-12)) injection 1,000 mcg      Review of Systems  Constitutional: Negative.   HENT:  Negative.   Eyes: Negative.        Recent pick eye  Respiratory: Negative.   Cardiovascular: Negative.   Gastrointestinal: Negative.   Endocrine: Negative.   Genitourinary: Negative.        Incont of Bowel and bladder  Musculoskeletal: Negative.   Skin: Negative.   Allergic/Immunologic: Negative.   Neurological: Negative.   Hematological: Negative.   Psychiatric/Behavioral: Negative.        Objective:   Physical Exam  Constitutional: She appears well-developed and well-nourished. No distress.  Patient looks  good today but does not remember my name.  I been taking care of her for many years.  She is smiling and appreciative however.  HENT:  Head: Normocephalic and atraumatic.  Right Ear: External ear normal.  Left Ear: External ear normal.  Nose: Nose normal.  Mouth/Throat: Oropharynx is clear and moist. No oropharyngeal exudate.  Eyes: Pupils are equal, round, and reactive to light. Conjunctivae and EOM are normal. Right eye exhibits no discharge. Left eye exhibits no discharge. No scleral icterus.  Neck: Normal range of motion. Neck supple. No thyromegaly present.  No bruits thyromegaly or anterior cervical adenopathy  Cardiovascular: Normal rate, regular rhythm and normal heart sounds.   No murmur heard. Heart is regular at 60/min  Pulmonary/Chest: Effort normal and breath sounds normal. No respiratory distress. She has no wheezes. She has no rales.  Clear anteriorly and posteriorly  Abdominal: Soft. Bowel sounds are normal. She exhibits no mass. There is no tenderness. There is no rebound and no guarding.  No abdominal tenderness masses or bruits  Musculoskeletal: She exhibits no edema.  she walks with assistance.  Lymphadenopathy:    She has no cervical adenopathy.  Neurological: She is alert. She has normal reflexes. No cranial nerve deficit.  Patient's memory is fading.  She is pleasant and thankful and does not hear well.  Skin: Skin is warm and dry. No rash noted.  Senile purpura  Psychiatric: She has a normal mood and affect. Her behavior is normal. Thought content normal.  Nursing note and vitals reviewed.  BP 124/66 (BP Location: Left Arm)   Pulse (!) 49   Temp (!) 96.6 F (35.9 C) (Oral)   Ht '5\' 1"'  (1.549 m)   Wt 102 lb (46.3 kg)   BMI 19.27 kg/m         Assessment & Plan:  1. Essential hypertension -The blood pressure is good today and has been good at home on current treatment with no change plan. - BMP8+EGFR - CBC with Differential/Platelet - Hepatic function  panel  2. Pure hypercholesterolemia -Continue current treatment and healthy eating habits with healthy exercise as possible - CBC with Differential/Platelet - Lipid panel  3. Vitamin D deficiency -Continue current treatment pending results of lab work - CBC with Differential/Platelet - VITAMIN D 25 Hydroxy (Vit-D Deficiency, Fractures)  4. Memory deficit -Continue with Aricept and Namenda - CBC with Differential/Platelet  5. Senile purpura ([place]) -She continues to have senile purpura on arms - CBC with Differential/Platelet  6. Thyroid cancer ([place]) -Check thyroid profile today - CBC with Differential/Platelet  7. Frailty -Monitor patient closely remove throw rugs and keep house well lit  No orders of the defined types were placed in this encounter.  Patient Instructions                       Medicare Annual Wellness Visit  [facility] and the medical providers at [facility]  strive to bring you the best medical care.  In doing so we not only want to address your current medical conditions and concerns but also to detect new conditions early and prevent illness, disease and health-related problems.    Medicare offers a yearly Wellness Visit which allows our clinical staff to assess your need for preventative services including immunizations, lifestyle education, counseling to decrease risk of preventable diseases and screening for fall risk and other medical concerns.    This visit is provided free of charge (no copay) for all Medicare recipients. The clinical pharmacists at [facility] have begun to conduct these Wellness Visits which will also include a thorough review of all your medications.    As you primary medical provider recommend that you make an appointment for your Annual Wellness Visit if you have not done so already this year.  You may set up this appointment before you leave today or you may call back ([phone]) and  schedule an appointment.  Please make sure when you call that you mention that you are scheduling your Annual Wellness Visit with the clinical pharmacist so that the appointment may be made for the proper length of time.     Continue current medications. Continue good therapeutic lifestyle changes which include good diet and exercise. Fall precautions discussed with patient. If an FOBT was given today- please return it to our front desk. If you are over 49 years old - you may need Prevnar 96 or the adult Pneumonia vaccine.  **Flu shots are available--- please call and schedule a FLU-CLINIC appointment**  After your visit with [place] today you will receive a survey in the mail or online from [employer] regarding your care with [place]. Please take a moment to fill this out. Your feedback is very important to [place] as you can help [place] better understand your patient needs as well as improve your experience and satisfaction. WE CARE ABOUT YOU!!!  Continue to monitor patient regularly Make sure that she eats healthy and drinks plenty of fluids and stays well-hydrated Consider an in-home camera that you can remotely monitor her with Encourage sleep at nighttime and awakeness during the daytime. Do not forget to get her eye exam as planned   Arrie Senate MD

## 2016-12-22 NOTE — Addendum Note (Signed)
Addended by: Zannie Cove on: 12/22/2016 03:47 PM   Modules accepted: Orders

## 2016-12-22 NOTE — Patient Instructions (Addendum)
Medicare Annual Wellness Visit  [facility] and the medical providers at [facility] strive to bring you the best medical care.  In doing so we not only want to address your current medical conditions and concerns but also to detect new conditions early and prevent illness, disease and health-related problems.    Medicare offers a yearly Wellness Visit which allows our clinical staff to assess your need for preventative services including immunizations, lifestyle education, counseling to decrease risk of preventable diseases and screening for fall risk and other medical concerns.    This visit is provided free of charge (no copay) for all Medicare recipients. The clinical pharmacists at [facility] have begun to conduct these Wellness Visits which will also include a thorough review of all your medications.    As you primary medical provider recommend that you make an appointment for your Annual Wellness Visit if you have not done so already this year.  You may set up this appointment before you leave today or you may call back ([phone]) and schedule an appointment.  Please make sure when you call that you mention that you are scheduling your Annual Wellness Visit with the clinical pharmacist so that the appointment may be made for the proper length of time.     Continue current medications. Continue good therapeutic lifestyle changes which include good diet and exercise. Fall precautions discussed with patient. If an FOBT was given today- please return it to our front desk. If you are over 81 years old - you may need Prevnar 71 or the adult Pneumonia vaccine.  **Flu shots are available--- please call and schedule a FLU-CLINIC appointment**  After your visit with [place] today you will receive a survey in the mail or online from [employer] regarding your care with [place]. Please take a moment to fill this out. Your feedback is very  important to [place] as you can help [place] better understand your patient needs as well as improve your experience and satisfaction. WE CARE ABOUT YOU!!!  Continue to monitor patient regularly Make sure that she eats healthy and drinks plenty of fluids and stays well-hydrated Consider an in-home camera that you can remotely monitor her with Encourage sleep at nighttime and awakeness during the daytime. Do not forget to get her eye exam as planned

## 2016-12-23 LAB — BMP8+EGFR
BUN / CREAT RATIO: 22 (ref 12–28)
BUN: 25 mg/dL (ref 10–36)
CALCIUM: 9.2 mg/dL (ref 8.7–10.3)
CHLORIDE: 102 mmol/L (ref 96–106)
CO2: 30 mmol/L — AB (ref 20–29)
CREATININE: 1.13 mg/dL — AB (ref 0.57–1.00)
GFR calc Af Amer: 48 mL/min/{1.73_m2} — ABNORMAL LOW (ref >59)
GFR calc non Af Amer: 42 mL/min/{1.73_m2} — ABNORMAL LOW (ref >59)
GLUCOSE: 66 mg/dL (ref 65–99)
Potassium: 4.4 mmol/L (ref 3.5–5.2)
Sodium: 144 mmol/L (ref 134–144)

## 2016-12-23 LAB — LIPID PANEL
Chol/HDL Ratio: 2.8 ratio (ref 0.0–4.4)
Cholesterol, Total: 177 mg/dL (ref 100–199)
HDL: 64 mg/dL (ref >39)
LDL Calculated: 99 mg/dL (ref 0–99)
Triglycerides: 68 mg/dL (ref 0–149)
VLDL Cholesterol Cal: 14 mg/dL (ref 5–40)

## 2016-12-23 LAB — CBC WITH DIFFERENTIAL/PLATELET
Basophils Absolute: 0 10*3/uL (ref 0.0–0.2)
Basos: 1 %
EOS (ABSOLUTE): 0.1 10*3/uL (ref 0.0–0.4)
EOS: 2 %
HEMATOCRIT: 37.3 % (ref 34.0–46.6)
HEMOGLOBIN: 11.9 g/dL (ref 11.1–15.9)
IMMATURE GRANS (ABS): 0 10*3/uL (ref 0.0–0.1)
Immature Granulocytes: 0 %
LYMPHS ABS: 2.5 10*3/uL (ref 0.7–3.1)
Lymphs: 36 %
MCH: 29.8 pg (ref 26.6–33.0)
MCHC: 31.9 g/dL (ref 31.5–35.7)
MCV: 94 fL (ref 79–97)
MONOCYTES: 8 %
MONOS ABS: 0.6 10*3/uL (ref 0.1–0.9)
NEUTROS PCT: 53 %
Neutrophils Absolute: 3.7 10*3/uL (ref 1.4–7.0)
Platelets: 202 10*3/uL (ref 150–379)
RBC: 3.99 x10E6/uL (ref 3.77–5.28)
RDW: 14.4 % (ref 12.3–15.4)
WBC: 6.9 10*3/uL (ref 3.4–10.8)

## 2016-12-23 LAB — HEPATIC FUNCTION PANEL
ALK PHOS: 35 IU/L — AB (ref 39–117)
ALT: 8 IU/L (ref 0–32)
AST: 15 IU/L (ref 0–40)
Albumin: 3.9 g/dL (ref 3.2–4.6)
BILIRUBIN, DIRECT: 0.06 mg/dL (ref 0.00–0.40)
Bilirubin Total: 0.2 mg/dL (ref 0.0–1.2)
Total Protein: 6.3 g/dL (ref 6.0–8.5)

## 2016-12-23 LAB — VITAMIN D 25 HYDROXY (VIT D DEFICIENCY, FRACTURES): VIT D 25 HYDROXY: 82.7 ng/mL (ref 30.0–100.0)

## 2017-03-19 DIAGNOSIS — H401113 Primary open-angle glaucoma, right eye, severe stage: Secondary | ICD-10-CM | POA: Diagnosis not present

## 2017-03-19 DIAGNOSIS — H401122 Primary open-angle glaucoma, left eye, moderate stage: Secondary | ICD-10-CM | POA: Diagnosis not present

## 2017-04-23 DIAGNOSIS — H401122 Primary open-angle glaucoma, left eye, moderate stage: Secondary | ICD-10-CM | POA: Diagnosis not present

## 2017-04-23 DIAGNOSIS — H401113 Primary open-angle glaucoma, right eye, severe stage: Secondary | ICD-10-CM | POA: Diagnosis not present

## 2017-04-28 ENCOUNTER — Other Ambulatory Visit: Payer: Self-pay | Admitting: Family Medicine

## 2017-04-30 ENCOUNTER — Other Ambulatory Visit: Payer: Self-pay | Admitting: *Deleted

## 2017-04-30 MED ORDER — ESCITALOPRAM OXALATE 10 MG PO TABS: 10.0000 mg | ORAL_TABLET | Freq: Every day | ORAL | 3 refills | Status: AC

## 2017-04-30 NOTE — Telephone Encounter (Signed)
OV 05/14/17

## 2017-05-14 ENCOUNTER — Ambulatory Visit (INDEPENDENT_AMBULATORY_CARE_PROVIDER_SITE_OTHER): Payer: Medicare Other | Admitting: Family Medicine

## 2017-05-14 ENCOUNTER — Encounter: Payer: Self-pay | Admitting: Family Medicine

## 2017-05-14 VITALS — BP 112/57 | HR 55 | Temp 96.6°F | Ht 61.0 in | Wt 108.0 lb

## 2017-05-14 DIAGNOSIS — E559 Vitamin D deficiency, unspecified: Secondary | ICD-10-CM | POA: Diagnosis not present

## 2017-05-14 DIAGNOSIS — R2681 Unsteadiness on feet: Secondary | ICD-10-CM

## 2017-05-14 DIAGNOSIS — R531 Weakness: Secondary | ICD-10-CM

## 2017-05-14 DIAGNOSIS — E7800 Pure hypercholesterolemia, unspecified: Secondary | ICD-10-CM

## 2017-05-14 DIAGNOSIS — D692 Other nonthrombocytopenic purpura: Secondary | ICD-10-CM

## 2017-05-14 DIAGNOSIS — R54 Age-related physical debility: Secondary | ICD-10-CM

## 2017-05-14 DIAGNOSIS — I10 Essential (primary) hypertension: Secondary | ICD-10-CM | POA: Diagnosis not present

## 2017-05-14 DIAGNOSIS — R413 Other amnesia: Secondary | ICD-10-CM | POA: Diagnosis not present

## 2017-05-14 DIAGNOSIS — C73 Malignant neoplasm of thyroid gland: Secondary | ICD-10-CM | POA: Diagnosis not present

## 2017-05-14 MED ORDER — MEMANTINE HCL ER 28 MG PO CP24: ORAL_CAPSULE | ORAL | 3 refills | Status: AC

## 2017-05-14 NOTE — Patient Instructions (Addendum)
Medicare Annual Wellness Visit  [facility] and the medical providers at [facility] strive to bring you the best medical care.  In doing so we not only want to address your current medical conditions and concerns but also to detect new conditions early and prevent illness, disease and health-related problems.    Medicare offers a yearly Wellness Visit which allows our clinical staff to assess your need for preventative services including immunizations, lifestyle education, counseling to decrease risk of preventable diseases and screening for fall risk and other medical concerns.    This visit is provided free of charge (no copay) for all Medicare recipients. The clinical pharmacists at [facility] have begun to conduct these Wellness Visits which will also include a thorough review of all your medications.    As you primary medical provider recommend that you make an appointment for your Annual Wellness Visit if you have not done so already this year.  You may set up this appointment before you leave today or you may call back ([phone]) and schedule an appointment.  Please make sure when you call that you mention that you are scheduling your Annual Wellness Visit with the clinical pharmacist so that the appointment may be made for the proper length of time.     Continue current medications. Continue good therapeutic lifestyle changes which include good diet and exercise. Fall precautions discussed with patient. If an FOBT was given today- please return it to our front desk. If you are over 18 years old - you may need Prevnar 67 or the adult Pneumonia vaccine.  **Flu shots are available--- please call and schedule a FLU-CLINIC appointment**  After your visit with [place] today you will receive a survey in the mail or online from [employer] regarding your care with [place]. Please take a moment to fill this out. Your feedback is very  important to [place] as you can help [place] better understand your patient needs as well as improve your experience and satisfaction. WE CARE ABOUT YOU!!!   We will arrange for physical therapy to come in home to do in-home gait strengthening The family should continue with the daytime care as this is seem to help the patient as far as her weight gain and her appearance Continue to practice fall prevention We will also do our best to schedule a visit with a podiatrist for nail trimming if not in [place] in [place].

## 2017-05-14 NOTE — Progress Notes (Signed)
Subjective:    Patient ID: Bianca Thompson, female    DOB: 12-04-1920, 84 y.o.   MRN: [id number]  HPI Pt here for follow up and management of chronic medical problems which includes hyperlipidemia and hypertension. She is taking medication regularly.  The patient is doing well other than becoming weaker.  She also has now someone staying with her from 10 AM to 4 PM daily.  She is requesting a refill on the Namenda.  She will return in FOBT and get lab work today.  No complaints or problems at home.  No chest pain pressure tightness or shortness of breath.  No trouble with her bowels no trouble with passing her water.  She recently did an overdose by taking 3 days of medicine at one time and since that time the family has had daytime help and is this has benefited the patient by her gaining weight and drinking more water and feeling better.  This daytime help is continuing.  She is weak and we will see if we can get home health to go in and do physical therapy for gait strengthening.  She will be 84 years old soon.    Patient Active Problem List   Diagnosis Date Noted  . Frailty 12/22/2016  . Vitamin D deficiency 12/22/2016  . Senile purpura ([place]) 07/08/2014  . Thyroid cancer ([place]) 08/22/2012  . Hyperlipidemia 08/22/2012  . Hypertension 08/22/2012  . Hyperthyroidism 08/22/2012  . Osteoporosis 08/22/2012  . Memory deficit 08/22/2012  . Degenerative arthritis of the lumbar spine 08/22/2012   Outpatient Encounter Medications as of 05/14/2017  Medication Sig  . Ascorbic Acid (VITAMIN C) 500 MG tablet Take 500 mg by mouth daily.    Marland Kitchen aspirin (SB LOW DOSE ASA EC) 81 MG EC tablet Take 81 mg by mouth daily.    . calcium-vitamin D (OSCAL WITH D) 500-200 MG-UNIT per tablet Take 1 tablet by mouth 2 (two) times daily.  . cholecalciferol (VITAMIN D) 1000 units tablet Take 1,000 Units by mouth daily.  Marland Kitchen docusate sodium (COLACE) 100 MG capsule Take 100 mg by mouth daily.  Marland Kitchen donepezil (ARICEPT) 10 MG  tablet Take 1 tablet (10 mg total) by mouth daily.  . dorzolamide-timolol (COSOPT) 22.3-6.8 MG/ML ophthalmic solution   . escitalopram (LEXAPRO) 10 MG tablet Take 1 tablet (10 mg total) by mouth daily.  . feeding supplement, ENSURE COMPLETE, (ENSURE COMPLETE) LIQD Take 237 mLs by mouth daily.  . Garlic 5621 MG CAPS Take 1,000 capsules by mouth daily.  Marland Kitchen latanoprost (XALATAN) 0.005 % ophthalmic solution   . levothyroxine (SYNTHROID, LEVOTHROID) 112 MCG tablet Take 1 tablet (112 mcg total) by mouth daily.  . memantine (NAMENDA XR) 28 MG CP24 24 hr capsule TAKE ONE CAPSULE BY MOUTH ONCE DAILY AT BEDTIME  . Omega-3 Fatty Acids (FISH OIL) 1200 MG CAPS Take 2 capsules by mouth daily. Take 2-3 tablets by mouth daily  . quinapril (ACCUPRIL) 40 MG tablet Take 1 tablet (40 mg total) by mouth daily.  . raloxifene (EVISTA) 60 MG tablet Take 1 tablet (60 mg total) by mouth daily.  . [DISCONTINUED] trimethoprim-polymyxin b (POLYTRIM) ophthalmic solution Place 1-2 drops into both eyes every 6 (six) hours.   Facility-Administered Encounter Medications as of 05/14/2017  Medication  . cyanocobalamin ((VITAMIN B-12)) injection 1,000 mcg      Review of Systems  Constitutional: Negative.   HENT: Negative.   Eyes: Negative.   Respiratory: Negative.   Cardiovascular: Negative.   Gastrointestinal: Negative.   Endocrine:  Negative.   Genitourinary: Negative.   Musculoskeletal: Negative.   Skin: Negative.   Allergic/Immunologic: Negative.   Neurological: Positive for weakness (decreased strength / gait instability ).  Hematological: Negative.   Psychiatric/Behavioral: Negative.        Objective:   Physical Exam  Constitutional: She is oriented to person, place, and time. No distress.  Patient is elderly and somewhat frail with declining memory.  HENT:  Head: Normocephalic and atraumatic.  Right Ear: External ear normal.  Left Ear: External ear normal.  Nose: Nose normal.  Mouth/Throat: Oropharynx  is clear and moist.  Eyes: Pupils are equal, round, and reactive to light. Conjunctivae and EOM are normal. Right eye exhibits no discharge. Left eye exhibits no discharge. No scleral icterus.  Neck: Normal range of motion. Neck supple. No thyromegaly present.  No bruits or thyromegaly  Cardiovascular: Normal rate, regular rhythm and normal heart sounds.  No murmur heard. The heart is regular today at 72/min  Pulmonary/Chest: Effort normal and breath sounds normal. No respiratory distress. She has no wheezes. She has no rales.  Clear anteriorly and posteriorly  Abdominal: Soft. Bowel sounds are normal. She exhibits no mass. There is no tenderness. There is no rebound and no guarding.  No abdominal tenderness masses organ enlargement or bruits  Musculoskeletal: She exhibits no edema or deformity.  Weak and fragile and needs assistance with her movements.  She is using a cane.  Lymphadenopathy:    She has no cervical adenopathy.  Neurological: She is alert and oriented to person, place, and time.  Skin: Skin is warm and dry. No rash noted.  The nails are thick and long on both feet and will need podiatry assistance for trimming  Psychiatric: She has a normal mood and affect. Her behavior is normal. Judgment and thought content normal.  Nursing note and vitals reviewed.   BP (!) 112/57 (BP Location: Left Arm)   Pulse (!) 55   Temp (!) 96.6 F (35.9 C) (Oral)   Ht _0  (1.549 m)   Wt 108 lb (49 kg)   BMI 20.41 kg/m        Assessment & Plan:  1. Essential hypertension -The blood pressure is good today and she will continue with current treatment which is currently nothing but Accupril. - BMP8+EGFR - CBC with Differential/Platelet - Hepatic function panel  2. Pure hypercholesterolemia -Continue to watch diet closely - CBC with Differential/Platelet - Lipid panel  3. Vitamin D deficiency -Continue with current treatment pending results of lab work - CBC with  Differential/Platelet - VITAMIN D 25 Hydroxy (Vit-D Deficiency, Fractures)  4. Memory deficit -This continues to decline but patient seems to be content with her current circumstances. - CBC with Differential/Platelet  5. Senile purpura ([facility]) -No increased bruising today. - CBC with Differential/Platelet  6. Frailty -Arrange for gait strengthening at home and fall prevention  7. Thyroid cancer ([place]) -Continue with current thyroid medicine and adjust if needed pending results of lab work  Meds ordered this encounter  Medications  . memantine (NAMENDA XR) 28 MG CP24 24 hr capsule    Sig: TAKE ONE CAPSULE BY MOUTH ONCE DAILY AT BEDTIME    Dispense:  90 capsule    Refill:  3   Patient Instructions                       Medicare Annual Wellness Visit  [facility] and the medical providers at [facility] strive to  bring you the best medical care.  In doing so we not only want to address your current medical conditions and concerns but also to detect new conditions early and prevent illness, disease and health-related problems.    Medicare offers a yearly Wellness Visit which allows our clinical staff to assess your need for preventative services including immunizations, lifestyle education, counseling to decrease risk of preventable diseases and screening for fall risk and other medical concerns.    This visit is provided free of charge (no copay) for all Medicare recipients. The clinical pharmacists at [facility] have begun to conduct these Wellness Visits which will also include a thorough review of all your medications.    As you primary medical provider recommend that you make an appointment for your Annual Wellness Visit if you have not done so already this year.  You may set up this appointment before you leave today or you may call back ([phone]) and schedule an appointment.  Please make sure when you call that you mention that you are  scheduling your Annual Wellness Visit with the clinical pharmacist so that the appointment may be made for the proper length of time.     Continue current medications. Continue good therapeutic lifestyle changes which include good diet and exercise. Fall precautions discussed with patient. If an FOBT was given today- please return it to our front desk. If you are over 24 years old - you may need Prevnar 17 or the adult Pneumonia vaccine.  **Flu shots are available--- please call and schedule a FLU-CLINIC appointment**  After your visit with [place] today you will receive a survey in the mail or online from [employer] regarding your care with [place]. Please take a moment to fill this out. Your feedback is very important to [place] as you can help [place] better understand your patient needs as well as improve your experience and satisfaction. WE CARE ABOUT YOU!!!   We will arrange for physical therapy to come in home to do in-home gait strengthening The family should continue with the daytime care as this is seem to help the patient as far as her weight gain and her appearance Continue to practice fall prevention We will also do our best to schedule a visit with a podiatrist for nail trimming if not in [place] in [place].  Arrie Senate MD

## 2017-05-15 DIAGNOSIS — E559 Vitamin D deficiency, unspecified: Secondary | ICD-10-CM | POA: Diagnosis not present

## 2017-05-15 DIAGNOSIS — I10 Essential (primary) hypertension: Secondary | ICD-10-CM | POA: Diagnosis not present

## 2017-05-15 LAB — BMP8+EGFR
BUN/Creatinine Ratio: 26 (ref 12–28)
BUN: 26 mg/dL (ref 10–36)
CHLORIDE: 107 mmol/L — AB (ref 96–106)
CO2: 24 mmol/L (ref 20–29)
Calcium: 8.5 mg/dL — ABNORMAL LOW (ref 8.7–10.3)
Creatinine, Ser: 1 mg/dL (ref 0.57–1.00)
GFR, EST AFRICAN AMERICAN: 56 mL/min/{1.73_m2} — AB (ref >59)
GFR, EST NON AFRICAN AMERICAN: 48 mL/min/{1.73_m2} — AB (ref >59)
Glucose: 79 mg/dL (ref 65–99)
POTASSIUM: 4.4 mmol/L (ref 3.5–5.2)
SODIUM: 146 mmol/L — AB (ref 134–144)

## 2017-05-15 LAB — LIPID PANEL
CHOLESTEROL TOTAL: 165 mg/dL (ref 100–199)
Chol/HDL Ratio: 2.7 ratio (ref 0.0–4.4)
HDL: 61 mg/dL (ref >39)
LDL Calculated: 82 mg/dL (ref 0–99)
Triglycerides: 112 mg/dL (ref 0–149)
VLDL CHOLESTEROL CAL: 22 mg/dL (ref 5–40)

## 2017-05-15 LAB — CBC WITH DIFFERENTIAL/PLATELET
BASOS ABS: 0 10*3/uL (ref 0.0–0.2)
BASOS: 0 %
EOS (ABSOLUTE): 0.1 10*3/uL (ref 0.0–0.4)
Eos: 2 %
Hematocrit: 36.4 % (ref 34.0–46.6)
Hemoglobin: 11.6 g/dL (ref 11.1–15.9)
IMMATURE GRANS (ABS): 0 10*3/uL (ref 0.0–0.1)
Immature Granulocytes: 0 %
LYMPHS ABS: 2.5 10*3/uL (ref 0.7–3.1)
LYMPHS: 31 %
MCH: 29.1 pg (ref 26.6–33.0)
MCHC: 31.9 g/dL (ref 31.5–35.7)
MCV: 91 fL (ref 79–97)
Monocytes Absolute: 0.7 10*3/uL (ref 0.1–0.9)
Monocytes: 9 %
NEUTROS ABS: 4.8 10*3/uL (ref 1.4–7.0)
Neutrophils: 58 %
PLATELETS: 205 10*3/uL (ref 150–379)
RBC: 3.99 x10E6/uL (ref 3.77–5.28)
RDW: 14.8 % (ref 12.3–15.4)
WBC: 8.2 10*3/uL (ref 3.4–10.8)

## 2017-05-15 LAB — HEPATIC FUNCTION PANEL
ALT: 8 IU/L (ref 0–32)
AST: 17 IU/L (ref 0–40)
Albumin: 3.5 g/dL (ref 3.2–4.6)
Alkaline Phosphatase: 36 IU/L — ABNORMAL LOW (ref 39–117)
Bilirubin, Direct: 0.07 mg/dL (ref 0.00–0.40)
Total Protein: 6.2 g/dL (ref 6.0–8.5)

## 2017-05-15 LAB — VITAMIN D 25 HYDROXY (VIT D DEFICIENCY, FRACTURES): Vit D, 25-Hydroxy: 65.7 ng/mL (ref 30.0–100.0)

## 2017-05-17 DIAGNOSIS — E559 Vitamin D deficiency, unspecified: Secondary | ICD-10-CM | POA: Diagnosis not present

## 2017-05-17 DIAGNOSIS — I10 Essential (primary) hypertension: Secondary | ICD-10-CM | POA: Diagnosis not present

## 2017-05-22 ENCOUNTER — Telehealth: Payer: Self-pay | Admitting: Family Medicine

## 2017-05-22 DIAGNOSIS — B351 Tinea unguium: Secondary | ICD-10-CM | POA: Diagnosis not present

## 2017-05-22 DIAGNOSIS — E559 Vitamin D deficiency, unspecified: Secondary | ICD-10-CM | POA: Diagnosis not present

## 2017-05-22 DIAGNOSIS — M79676 Pain in unspecified toe(s): Secondary | ICD-10-CM | POA: Diagnosis not present

## 2017-05-22 DIAGNOSIS — I10 Essential (primary) hypertension: Secondary | ICD-10-CM | POA: Diagnosis not present

## 2017-05-22 DIAGNOSIS — I70203 Unspecified atherosclerosis of native arteries of extremities, bilateral legs: Secondary | ICD-10-CM | POA: Diagnosis not present

## 2017-05-22 NOTE — Telephone Encounter (Signed)
Mitzi faxed

## 2017-05-23 ENCOUNTER — Ambulatory Visit: Payer: Medicare Other | Admitting: Family Medicine

## 2017-05-24 DIAGNOSIS — I10 Essential (primary) hypertension: Secondary | ICD-10-CM | POA: Diagnosis not present

## 2017-05-24 DIAGNOSIS — E559 Vitamin D deficiency, unspecified: Secondary | ICD-10-CM | POA: Diagnosis not present

## 2017-05-25 ENCOUNTER — Ambulatory Visit (INDEPENDENT_AMBULATORY_CARE_PROVIDER_SITE_OTHER): Payer: Medicare Other

## 2017-05-25 DIAGNOSIS — E785 Hyperlipidemia, unspecified: Secondary | ICD-10-CM

## 2017-05-25 DIAGNOSIS — Z9181 History of falling: Secondary | ICD-10-CM | POA: Diagnosis not present

## 2017-05-25 DIAGNOSIS — E0590 Thyrotoxicosis, unspecified without thyrotoxic crisis or storm: Secondary | ICD-10-CM

## 2017-05-25 DIAGNOSIS — D692 Other nonthrombocytopenic purpura: Secondary | ICD-10-CM

## 2017-05-25 DIAGNOSIS — I10 Essential (primary) hypertension: Secondary | ICD-10-CM

## 2017-05-25 DIAGNOSIS — Z85850 Personal history of malignant neoplasm of thyroid: Secondary | ICD-10-CM

## 2017-05-25 DIAGNOSIS — E7800 Pure hypercholesterolemia, unspecified: Secondary | ICD-10-CM | POA: Diagnosis not present

## 2017-05-25 DIAGNOSIS — M5136 Other intervertebral disc degeneration, lumbar region: Secondary | ICD-10-CM

## 2017-05-25 DIAGNOSIS — M810 Age-related osteoporosis without current pathological fracture: Secondary | ICD-10-CM

## 2017-05-25 DIAGNOSIS — E559 Vitamin D deficiency, unspecified: Secondary | ICD-10-CM

## 2017-05-29 DIAGNOSIS — E559 Vitamin D deficiency, unspecified: Secondary | ICD-10-CM | POA: Diagnosis not present

## 2017-05-29 DIAGNOSIS — I10 Essential (primary) hypertension: Secondary | ICD-10-CM | POA: Diagnosis not present

## 2017-05-31 DIAGNOSIS — E559 Vitamin D deficiency, unspecified: Secondary | ICD-10-CM | POA: Diagnosis not present

## 2017-05-31 DIAGNOSIS — I10 Essential (primary) hypertension: Secondary | ICD-10-CM | POA: Diagnosis not present

## 2017-06-29 DIAGNOSIS — Z79810 Long term (current) use of selective estrogen receptor modulators (SERMs): Secondary | ICD-10-CM | POA: Diagnosis not present

## 2017-06-29 DIAGNOSIS — R54 Age-related physical debility: Secondary | ICD-10-CM | POA: Diagnosis not present

## 2017-06-29 DIAGNOSIS — J690 Pneumonitis due to inhalation of food and vomit: Secondary | ICD-10-CM | POA: Diagnosis not present

## 2017-06-29 DIAGNOSIS — R918 Other nonspecific abnormal finding of lung field: Secondary | ICD-10-CM | POA: Diagnosis not present

## 2017-06-29 DIAGNOSIS — R112 Nausea with vomiting, unspecified: Secondary | ICD-10-CM | POA: Diagnosis not present

## 2017-06-29 DIAGNOSIS — Z7982 Long term (current) use of aspirin: Secondary | ICD-10-CM | POA: Diagnosis not present

## 2017-06-29 DIAGNOSIS — I10 Essential (primary) hypertension: Secondary | ICD-10-CM | POA: Diagnosis not present

## 2017-06-29 DIAGNOSIS — G301 Alzheimer's disease with late onset: Secondary | ICD-10-CM | POA: Diagnosis not present

## 2017-06-29 DIAGNOSIS — R05 Cough: Secondary | ICD-10-CM | POA: Diagnosis not present

## 2017-06-29 DIAGNOSIS — K222 Esophageal obstruction: Secondary | ICD-10-CM | POA: Diagnosis not present

## 2017-06-29 DIAGNOSIS — Z85850 Personal history of malignant neoplasm of thyroid: Secondary | ICD-10-CM | POA: Diagnosis not present

## 2017-06-29 DIAGNOSIS — Z79899 Other long term (current) drug therapy: Secondary | ICD-10-CM | POA: Diagnosis not present

## 2017-06-29 DIAGNOSIS — E039 Hypothyroidism, unspecified: Secondary | ICD-10-CM | POA: Diagnosis not present

## 2017-06-29 DIAGNOSIS — K449 Diaphragmatic hernia without obstruction or gangrene: Secondary | ICD-10-CM | POA: Diagnosis not present

## 2017-06-29 DIAGNOSIS — E785 Hyperlipidemia, unspecified: Secondary | ICD-10-CM | POA: Diagnosis not present

## 2017-06-29 DIAGNOSIS — T18128A Food in esophagus causing other injury, initial encounter: Secondary | ICD-10-CM | POA: Diagnosis not present

## 2017-06-29 DIAGNOSIS — J8410 Pulmonary fibrosis, unspecified: Secondary | ICD-10-CM | POA: Diagnosis not present

## 2017-06-29 DIAGNOSIS — F0280 Dementia in other diseases classified elsewhere without behavioral disturbance: Secondary | ICD-10-CM | POA: Diagnosis not present

## 2017-06-29 DIAGNOSIS — J189 Pneumonia, unspecified organism: Secondary | ICD-10-CM | POA: Diagnosis not present

## 2017-06-30 DIAGNOSIS — E785 Hyperlipidemia, unspecified: Secondary | ICD-10-CM | POA: Diagnosis not present

## 2017-06-30 DIAGNOSIS — K222 Esophageal obstruction: Secondary | ICD-10-CM | POA: Diagnosis not present

## 2017-06-30 DIAGNOSIS — E039 Hypothyroidism, unspecified: Secondary | ICD-10-CM | POA: Diagnosis not present

## 2017-06-30 DIAGNOSIS — F0280 Dementia in other diseases classified elsewhere without behavioral disturbance: Secondary | ICD-10-CM | POA: Diagnosis not present

## 2017-06-30 DIAGNOSIS — R54 Age-related physical debility: Secondary | ICD-10-CM | POA: Diagnosis not present

## 2017-06-30 DIAGNOSIS — T18128A Food in esophagus causing other injury, initial encounter: Secondary | ICD-10-CM | POA: Diagnosis not present

## 2017-06-30 DIAGNOSIS — J189 Pneumonia, unspecified organism: Secondary | ICD-10-CM | POA: Diagnosis not present

## 2017-06-30 DIAGNOSIS — I10 Essential (primary) hypertension: Secondary | ICD-10-CM | POA: Diagnosis not present

## 2017-06-30 DIAGNOSIS — G301 Alzheimer's disease with late onset: Secondary | ICD-10-CM | POA: Diagnosis not present

## 2017-06-30 DIAGNOSIS — J690 Pneumonitis due to inhalation of food and vomit: Secondary | ICD-10-CM | POA: Diagnosis not present

## 2017-07-02 ENCOUNTER — Telehealth: Payer: Self-pay | Admitting: Family Medicine

## 2017-07-02 DIAGNOSIS — M47819 Spondylosis without myelopathy or radiculopathy, site unspecified: Secondary | ICD-10-CM | POA: Diagnosis not present

## 2017-07-02 DIAGNOSIS — K222 Esophageal obstruction: Secondary | ICD-10-CM | POA: Diagnosis not present

## 2017-07-02 DIAGNOSIS — J690 Pneumonitis due to inhalation of food and vomit: Secondary | ICD-10-CM | POA: Diagnosis not present

## 2017-07-02 DIAGNOSIS — T18128D Food in esophagus causing other injury, subsequent encounter: Secondary | ICD-10-CM | POA: Diagnosis not present

## 2017-07-02 DIAGNOSIS — I10 Essential (primary) hypertension: Secondary | ICD-10-CM | POA: Diagnosis not present

## 2017-07-02 DIAGNOSIS — G301 Alzheimer's disease with late onset: Secondary | ICD-10-CM | POA: Diagnosis not present

## 2017-07-02 MED ORDER — PANTOPRAZOLE SODIUM 40 MG PO TBEC
40.00 | DELAYED_RELEASE_TABLET | ORAL | Status: DC
Start: 2017-06-30 — End: 2017-07-02

## 2017-07-02 MED ORDER — HYDRALAZINE HCL 20 MG/ML IJ SOLN
10.00 | INTRAMUSCULAR | Status: DC
Start: ? — End: 2017-07-02

## 2017-07-02 MED ORDER — GENERIC EXTERNAL MEDICATION
Status: DC
Start: ? — End: 2017-07-02

## 2017-07-02 MED ORDER — GENERIC EXTERNAL MEDICATION
2.00 | Status: DC
Start: 2017-07-01 — End: 2017-07-02

## 2017-07-02 MED ORDER — ACETAMINOPHEN 325 MG PO TABS
650.00 | ORAL_TABLET | ORAL | Status: DC
Start: ? — End: 2017-07-02

## 2017-07-02 MED ORDER — ENOXAPARIN SODIUM 30 MG/0.3ML ~~LOC~~ SOLN
30.00 | SUBCUTANEOUS | Status: DC
Start: 2017-07-01 — End: 2017-07-02

## 2017-07-02 MED ORDER — SODIUM CHLORIDE 0.9 % IV SOLN
10.00 | INTRAVENOUS | Status: DC
Start: ? — End: 2017-07-02

## 2017-07-02 MED ORDER — POLYETHYLENE GLYCOL 3350 17 G PO PACK
17.00 | PACK | ORAL | Status: DC
Start: ? — End: 2017-07-02

## 2017-07-02 MED ORDER — ACETAMINOPHEN 650 MG RE SUPP
650.00 | RECTAL | Status: DC
Start: ? — End: 2017-07-02

## 2017-07-02 NOTE — Telephone Encounter (Signed)
Informed that [facility] was to be contacted by hospital, gave her number to check with them and to let me know if I can help with anything else

## 2017-07-02 NOTE — Telephone Encounter (Signed)
Pt was taken to ER Friday and they removed chicken from her esophagus. They took her to [facility]. Pt Daughter in law wanted Dr. Laurance Flatten to know. She came home Saturday. They also have made a change in med they took her of all vitamins, because of the size of medications. They want her to come back in a month and stretch the esophagus. She did aspirate in Right lung. She was put on Omncief for 5 days.

## 2017-07-04 ENCOUNTER — Ambulatory Visit (INDEPENDENT_AMBULATORY_CARE_PROVIDER_SITE_OTHER): Payer: Medicare Other

## 2017-07-04 ENCOUNTER — Encounter: Payer: Self-pay | Admitting: Pediatrics

## 2017-07-04 ENCOUNTER — Ambulatory Visit (INDEPENDENT_AMBULATORY_CARE_PROVIDER_SITE_OTHER): Payer: Medicare Other | Admitting: Pediatrics

## 2017-07-04 VITALS — BP 136/64 | HR 64 | Temp 97.0°F | Resp 22 | Ht 61.0 in

## 2017-07-04 DIAGNOSIS — J690 Pneumonitis due to inhalation of food and vomit: Secondary | ICD-10-CM

## 2017-07-04 DIAGNOSIS — R531 Weakness: Secondary | ICD-10-CM

## 2017-07-04 DIAGNOSIS — R2681 Unsteadiness on feet: Secondary | ICD-10-CM

## 2017-07-04 DIAGNOSIS — R54 Age-related physical debility: Secondary | ICD-10-CM

## 2017-07-04 DIAGNOSIS — R918 Other nonspecific abnormal finding of lung field: Secondary | ICD-10-CM | POA: Diagnosis not present

## 2017-07-04 LAB — CBC WITH DIFFERENTIAL/PLATELET
BASOS: 0 %
Basophils Absolute: 0 10*3/uL (ref 0.0–0.2)
EOS (ABSOLUTE): 0.1 10*3/uL (ref 0.0–0.4)
Eos: 1 %
HEMATOCRIT: 38.3 % (ref 34.0–46.6)
Hemoglobin: 12.6 g/dL (ref 11.1–15.9)
IMMATURE GRANS (ABS): 0.1 10*3/uL (ref 0.0–0.1)
Immature Granulocytes: 1 %
Lymphocytes Absolute: 1 10*3/uL (ref 0.7–3.1)
Lymphs: 10 %
MCH: 30.2 pg (ref 26.6–33.0)
MCHC: 32.9 g/dL (ref 31.5–35.7)
MCV: 92 fL (ref 79–97)
MONOS ABS: 0.8 10*3/uL (ref 0.1–0.9)
Monocytes: 8 %
NEUTROS ABS: 7.8 10*3/uL — AB (ref 1.4–7.0)
Neutrophils: 80 %
Platelets: 209 10*3/uL (ref 150–379)
RBC: 4.17 x10E6/uL (ref 3.77–5.28)
RDW: 14.6 % (ref 12.3–15.4)
WBC: 9.7 10*3/uL (ref 3.4–10.8)

## 2017-07-04 LAB — BMP8+EGFR
BUN / CREAT RATIO: 27 (ref 12–28)
BUN: 29 mg/dL (ref 10–36)
CO2: 22 mmol/L (ref 20–29)
Calcium: 8.2 mg/dL — ABNORMAL LOW (ref 8.7–10.3)
Chloride: 106 mmol/L (ref 96–106)
Creatinine, Ser: 1.07 mg/dL — ABNORMAL HIGH (ref 0.57–1.00)
GFR, EST AFRICAN AMERICAN: 51 mL/min/{1.73_m2} — AB (ref >59)
GFR, EST NON AFRICAN AMERICAN: 44 mL/min/{1.73_m2} — AB (ref >59)
Glucose: 99 mg/dL (ref 65–99)
POTASSIUM: 3.6 mmol/L (ref 3.5–5.2)
SODIUM: 143 mmol/L (ref 134–144)

## 2017-07-04 MED ORDER — AMOXICILLIN-POT CLAVULANATE 875-125 MG PO TABS: 1.0000 | ORAL_TABLET | Freq: Two times a day (BID) | ORAL | 0 refills | Status: DC

## 2017-07-04 NOTE — Progress Notes (Addendum)
Subjective:   Patient ID: Bianca Thompson, female    DOB: September 10, 1920, 84 y.o.   MRN: [id number] CC: Shortness of Breath  HPI: Bianca Thompson is a 84 y.o. female   Admitted overnight at [facility], discharged 4 days ago after presenting with shortness of breath, coughing, episodes of vomiting after eating lunch.  She was found to have an esophageal obstruction and new right lower lobe infiltrate on chest x-ray.  She was taken to the OR with GI for removal of the food bolus was stuck.  She was discharged on Omnicef x5 days for possible pneumonia versus aspiration pneumonitis.  She has 1 day left of antibiotic.  Here today with her daughter-in-law.  She says she has not gotten back to her baseline from before the aspiration event on Friday.  She is weaker than usual, was using a cane to get around, now needing more support, not able to walk more than a few steps with support.  Having some shaking in her hands off and on more than usual.  She is been a little bit more confused than usual, not talking as much.  She is usually oriented to person and place.  Appetite has been okay.  Before the aspiration event she tended to hold food in her cheeks.  Now she has to be actively reminded to eat slower, chewing and swallowing and taking small bites.  She has 24-hour care at home provided through family.  Home health was started after discharge home from the hospital with 24-hour helpline.  No fevers at home.  Patient says she is not bothered by her breathing right now.  No coughing at home.  Relevant past medical, surgical, family and social history reviewed. Allergies and medications reviewed and updated. Social History   Tobacco Use  Smoking Status Never Smoker  Smokeless Tobacco Never Used   ROS: Per HPI   Objective:    BP 136/64   Pulse 64   Temp (!) 97 F (36.1 C) (Oral)   Resp (!) 22   Ht 5' 1" (1.549 m)   SpO2 93%   BMI 20.41 kg/m   Wt Readings from Last 3 Encounters:  05/14/17 108 lb  (49 kg)  12/22/16 102 lb (46.3 kg)  07/04/16 104 lb (47.2 kg)    Gen: NAD, alert, cooperative with exam, hard of hearing EYES: EOMI, no conjunctival injection, or no icterus ENT:   OP without erythema CV: NRRR, normal S1/S2, no murmur, distal pulses 2+ b/l Resp: Crackles and rhonchi right lung base, no wheezes, comfortable WOB Abd: +BS, soft, NTND.  Ext: No edema, warm Neuro: Alert and oriented to person and place, sitting in wheelchair, no tremor.  Assessment & Plan:  Jniyah was seen today for shortness of breath.  Diagnoses and all orders for this visit:  Aspiration pneumonia of right lower lobe due to regurgitated food ([place]) Bilateral lower lobe infiltrates on chest x-ray, pneumonia versus pneumonitis.  O2 93% today. Will switch antibiotic to Augmentin to cover aspiration pneumonia.  Home health nurse to come out tomorrow the next day to check oxygen levels and reassess patient.  Head of bed to stay at 45 degrees. -     DG Chest 2 View; Future -     BMP8+EGFR -     CBC with Differential/Platelet -     amoxicillin-clavulanate (AUGMENTIN) 875-125 MG tablet; Take 1 tablet by mouth 2 (two) times daily.  Esophageal stricture Has appointment with GI upcoming for dilation of the esophagus.  Family closely monitoring eating, cutting food into small bites per GI recommendations.    General weakness  Gait instability Increased since recent event.  Follow up plan: 1 week, sooner if needed, HH to reassess pt within 48 hrs. return precautions discussed. Assunta Found, MD [place]

## 2017-07-05 ENCOUNTER — Telehealth: Payer: Self-pay | Admitting: Family Medicine

## 2017-07-05 ENCOUNTER — Ambulatory Visit: Payer: Medicare Other | Admitting: Family Medicine

## 2017-07-05 DIAGNOSIS — J690 Pneumonitis due to inhalation of food and vomit: Secondary | ICD-10-CM

## 2017-07-05 DIAGNOSIS — R2681 Unsteadiness on feet: Secondary | ICD-10-CM

## 2017-07-05 DIAGNOSIS — R413 Other amnesia: Secondary | ICD-10-CM

## 2017-07-05 DIAGNOSIS — D692 Other nonthrombocytopenic purpura: Secondary | ICD-10-CM

## 2017-07-05 DIAGNOSIS — R531 Weakness: Secondary | ICD-10-CM

## 2017-07-05 DIAGNOSIS — R54 Age-related physical debility: Secondary | ICD-10-CM

## 2017-07-05 DIAGNOSIS — R296 Repeated falls: Secondary | ICD-10-CM

## 2017-07-05 NOTE — Telephone Encounter (Signed)
hosp bed orders placed

## 2017-07-06 ENCOUNTER — Encounter: Payer: Self-pay | Admitting: Pediatrics

## 2017-07-06 ENCOUNTER — Ambulatory Visit: Payer: Medicare Other | Admitting: Nurse Practitioner

## 2017-07-06 ENCOUNTER — Ambulatory Visit: Payer: Medicare Other | Admitting: Family Medicine

## 2017-07-06 ENCOUNTER — Ambulatory Visit (INDEPENDENT_AMBULATORY_CARE_PROVIDER_SITE_OTHER): Payer: Medicare Other | Admitting: Pediatrics

## 2017-07-06 ENCOUNTER — Telehealth: Payer: Self-pay | Admitting: Family Medicine

## 2017-07-06 VITALS — BP 128/60 | HR 70 | Temp 98.7°F | Resp 22

## 2017-07-06 DIAGNOSIS — J690 Pneumonitis due to inhalation of food and vomit: Secondary | ICD-10-CM

## 2017-07-06 DIAGNOSIS — R0902 Hypoxemia: Secondary | ICD-10-CM

## 2017-07-06 DIAGNOSIS — R918 Other nonspecific abnormal finding of lung field: Secondary | ICD-10-CM | POA: Diagnosis not present

## 2017-07-06 DIAGNOSIS — R262 Difficulty in walking, not elsewhere classified: Secondary | ICD-10-CM | POA: Diagnosis not present

## 2017-07-06 DIAGNOSIS — Z9981 Dependence on supplemental oxygen: Secondary | ICD-10-CM | POA: Diagnosis not present

## 2017-07-06 DIAGNOSIS — R531 Weakness: Secondary | ICD-10-CM | POA: Diagnosis not present

## 2017-07-06 DIAGNOSIS — K222 Esophageal obstruction: Secondary | ICD-10-CM | POA: Diagnosis not present

## 2017-07-06 DIAGNOSIS — Z7189 Other specified counseling: Secondary | ICD-10-CM | POA: Diagnosis not present

## 2017-07-06 DIAGNOSIS — T18128D Food in esophagus causing other injury, subsequent encounter: Secondary | ICD-10-CM | POA: Diagnosis not present

## 2017-07-06 DIAGNOSIS — I10 Essential (primary) hypertension: Secondary | ICD-10-CM | POA: Diagnosis not present

## 2017-07-06 DIAGNOSIS — F0390 Unspecified dementia without behavioral disturbance: Secondary | ICD-10-CM | POA: Diagnosis not present

## 2017-07-06 DIAGNOSIS — M47819 Spondylosis without myelopathy or radiculopathy, site unspecified: Secondary | ICD-10-CM | POA: Diagnosis not present

## 2017-07-06 DIAGNOSIS — G301 Alzheimer's disease with late onset: Secondary | ICD-10-CM | POA: Diagnosis not present

## 2017-07-06 MED ORDER — AMOXICILLIN-POT CLAVULANATE 400-57 MG/5ML PO SUSR: 800.0000 mg | Freq: Two times a day (BID) | ORAL | 0 refills | Status: AC

## 2017-07-06 NOTE — Telephone Encounter (Signed)
Pt's daughter is stating her O2 sats are dropping to the mid 70's but then will come back up when pt is told to take deep breaths but only to mid 80's. Advised that pt needs to be seen and evaluated but daughter is requesting order of oxygen. I have put her in MMM schedule and advised them to bring her in. Pt's daughter requesting I send you a message first as it is hard to get her in here.

## 2017-07-06 NOTE — Progress Notes (Signed)
  Subjective:   Patient ID: Bianca Thompson, female    DOB: 01/23/1921, 84 y.o.   MRN: [id number] CC: Low Oxygen  HPI: Bianca Thompson is a 84 y.o. female   Here today with her daughter-in-law and son.  Home health was at the house earlier today, oxygen levels are found to be in the 70s.  Came up to mid 45s.  Came in for office visit.  Family very hesitant to bring patient back to the hospital because she did not want to go back to the hospital.  Recent food impaction, aspiration pneumonia or pneumonitis.  She has not had any fevers.  Her blood pressure has been fine.  No fevers.  Per family she has not been as wakeful as usual.  She has 24-hour care at home right now.  She is usually able to walk on her own around home.  Now she needs assistance taking just a few steps.  She says she is feeling fine would like to go home.  They have had to cut up the antibiotic pill into quarters in order for her to be able to take it.  Relevant past medical, surgical, family and social history reviewed. Allergies and medications reviewed and updated. Social History   Tobacco Use  Smoking Status Never Smoker  Smokeless Tobacco Never Used   ROS: Per HPI   Objective:    BP 128/60   Pulse 70   Temp 98.7 F (37.1 C) (Oral)   Resp (!) 22   SpO2 97%   Wt Readings from Last 3 Encounters:  05/14/17 108 lb (49 kg)  12/22/16 102 lb (46.3 kg)  07/04/16 104 lb (47.2 kg)    Initially oxygen levels normal.  Getting the patient up to walk a couple steps oxygen levels dropped to 70%.  Improved up to over 90 with a nonrebreather, then able to decrease to 4 L nasal cannula.  Patient had periods of time with oxygen levels dropping into the 80s at rest on nasal cannula oxygen.  Gen: NAD, comfortable appearing, sitting in a wheelchair.   EYES: EOMI, no conjunctival injection, or no icterus CV: NRRR, normal S1/S2, no murmur, distal pulses 2+ b/l Resp: Crackles bilateral bases, moving air well.  Comfortable work of  breathing.   Abd: +BS, soft, NTND.  Ext: No edema, warm Neuro: Alert and answering questions appropriately.  Assessment & Plan:  Bianca Thompson was seen today for low oxygen.  Diagnoses and all orders for this visit:  Advance care planning Here today with her son and daughter-in-law.  They would like to avoid the hospital if they can.  She has expressed desire to stay out of hospital, saying she wants to stay home.  Now with worsening oxygen levels, sleeping most of the day. Last week they made the decision to change code status to DNI/DNR.  Discussed options going forward.  Family wants to take patient home, continue treatment with antibiotics for aspiration pneumonia, treat with oxygen if needed at home but they do want to avoid the hospital and further escalation of care for this illness.  They agree with hospice referral.   Aspiration pneumonia of right lower lobe due to regurgitated food [facility]) Having difficulty swallowing the large Augmentin tablet.  Will switch to liquid. -     Ambulatory referral to Hospice  Hypoxia Sending home on oxygen.  Follow up plan: Return if symptoms worsen or fail to improve. Assunta Found, MD [place]

## 2017-07-06 NOTE — Progress Notes (Signed)
  Subjective:   Patient ID: [name], female    DOB: 1920/11/24, 84 y.o.   MRN: [id number] CC: Low Oxygen  HPI: MYRTA MERCER is a 84 y.o. female   Here today with her daughter-in-law and son.  Home health was at the house earlier today, oxygen levels are found to be in the 70s.  Came up to mid 28s.  Came in for office visit.  Family very hesitant to bring patient back to the hospital because she did not want to go back to the hospital.  Recent food impaction, aspiration pneumonia or pneumonitis.  She has not had any fevers.  Her blood pressure has been fine.  No fevers.  Per family she has not been as wakeful as usual.  She has 24-hour care at home right now.  She is usually able to walk on her own around home.  Now she needs assistance taking just a few steps.  She says she is feeling fine would like to go home.  They have had to cut up the antibiotic pill into quarters in order for her to be able to take it.  Relevant past medical, surgical, family and social history reviewed. Allergies and medications reviewed and updated. Social History   Tobacco Use  Smoking Status Never Smoker  Smokeless Tobacco Never Used   ROS: Per HPI   Objective:    BP 128/60   Pulse 70   Temp 98.7 F (37.1 C) (Oral)   Resp (!) 22   SpO2 97%   Wt Readings from Last 3 Encounters:  05/14/17 108 lb (49 kg)  12/22/16 102 lb (46.3 kg)  07/04/16 104 lb (47.2 kg)    Initially oxygen levels normal.  Getting the patient up to walk a couple steps oxygen levels dropped to 70%.  Improved up to over 90 with a nonrebreather, then able to decrease to 4 L nasal cannula.  Patient had periods of time with oxygen levels dropping into the 80s at rest on nasal cannula oxygen.  Gen: NAD, comfortable appearing, sitting in a wheelchair.   EYES: EOMI, no conjunctival injection, or no icterus CV: NRRR, normal S1/S2, no murmur, distal pulses 2+ b/l Resp: Crackles bilateral bases, moving air well.  Comfortable work of  breathing.   Abd: +BS, soft, NTND.  Ext: No edema, warm Neuro: Alert and answering questions appropriately.  Assessment & Plan:  L[place] was seen today for low oxygen.  Diagnoses and all orders for this visit:  Advance care planning Here today with her son and daughter-in-law.  They would like to avoid the hospital if they can.  She has expressed desire to stay out of hospital, saying she wants to stay home.  Now with worsening oxygen levels, sleeping most of the day. Last week they made the decision to change code status to DNI/DNR.  Discussed options going forward.  Family wants to take patient home, continue treatment with antibiotics for aspiration pneumonia, treat with oxygen if needed at home but they do want to avoid the hospital and further escalation of care for this illness.  They agree with hospice referral.   Aspiration pneumonia of right lower lobe due to regurgitated food [facility]) -     Ambulatory referral to Hospice  Hypoxia Sending home on oxygen.  Follow up plan: Return if symptoms worsen or fail to improve. Assunta Found, MD [place]

## 2017-07-09 DIAGNOSIS — J690 Pneumonitis due to inhalation of food and vomit: Secondary | ICD-10-CM | POA: Diagnosis not present

## 2017-07-09 DIAGNOSIS — R262 Difficulty in walking, not elsewhere classified: Secondary | ICD-10-CM | POA: Diagnosis not present

## 2017-07-09 DIAGNOSIS — F0390 Unspecified dementia without behavioral disturbance: Secondary | ICD-10-CM | POA: Diagnosis not present

## 2017-07-09 DIAGNOSIS — R918 Other nonspecific abnormal finding of lung field: Secondary | ICD-10-CM | POA: Diagnosis not present

## 2017-07-09 DIAGNOSIS — Z9981 Dependence on supplemental oxygen: Secondary | ICD-10-CM | POA: Diagnosis not present

## 2017-07-09 DIAGNOSIS — R531 Weakness: Secondary | ICD-10-CM | POA: Diagnosis not present

## 2017-07-10 DIAGNOSIS — J690 Pneumonitis due to inhalation of food and vomit: Secondary | ICD-10-CM | POA: Diagnosis not present

## 2017-07-10 DIAGNOSIS — R918 Other nonspecific abnormal finding of lung field: Secondary | ICD-10-CM | POA: Diagnosis not present

## 2017-07-10 DIAGNOSIS — Z9981 Dependence on supplemental oxygen: Secondary | ICD-10-CM | POA: Diagnosis not present

## 2017-07-10 DIAGNOSIS — R531 Weakness: Secondary | ICD-10-CM | POA: Diagnosis not present

## 2017-07-10 DIAGNOSIS — F0390 Unspecified dementia without behavioral disturbance: Secondary | ICD-10-CM | POA: Diagnosis not present

## 2017-07-10 DIAGNOSIS — R262 Difficulty in walking, not elsewhere classified: Secondary | ICD-10-CM | POA: Diagnosis not present

## 2017-07-11 DIAGNOSIS — R262 Difficulty in walking, not elsewhere classified: Secondary | ICD-10-CM | POA: Diagnosis not present

## 2017-07-11 DIAGNOSIS — R918 Other nonspecific abnormal finding of lung field: Secondary | ICD-10-CM | POA: Diagnosis not present

## 2017-07-11 DIAGNOSIS — R531 Weakness: Secondary | ICD-10-CM | POA: Diagnosis not present

## 2017-07-11 DIAGNOSIS — Z9981 Dependence on supplemental oxygen: Secondary | ICD-10-CM | POA: Diagnosis not present

## 2017-07-11 DIAGNOSIS — J690 Pneumonitis due to inhalation of food and vomit: Secondary | ICD-10-CM | POA: Diagnosis not present

## 2017-07-11 DIAGNOSIS — F0390 Unspecified dementia without behavioral disturbance: Secondary | ICD-10-CM | POA: Diagnosis not present

## 2017-07-13 ENCOUNTER — Telehealth: Payer: Self-pay | Admitting: Family Medicine

## 2017-07-21 DEATH — deceased

## 2017-08-20 DEATH — deceased

## 2017-09-18 ENCOUNTER — Ambulatory Visit: Payer: Medicare Other | Admitting: Family Medicine

## 2018-11-21 IMAGING — DX DG CHEST 2V
3 series · 3 of 3 positions shown · non-contrast
Comparison: Chest radiographs 05/26/2015.

CLINICAL DATA: [AGE] female with aspiration.

EXAM:
CHEST - 2 VIEW

[chest ap (1 of 2)]
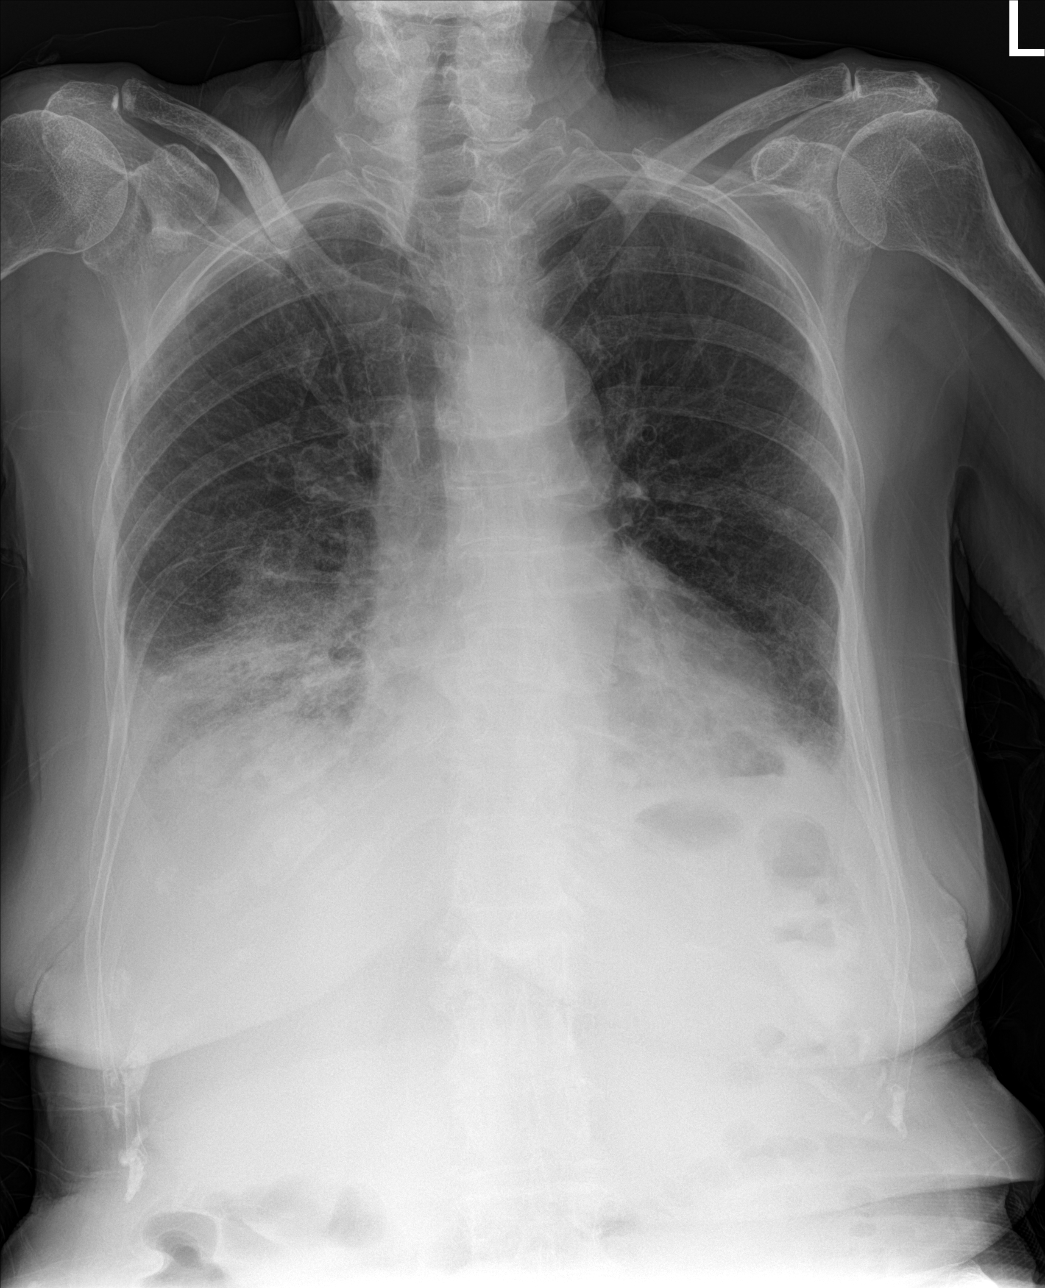

[chest lat]
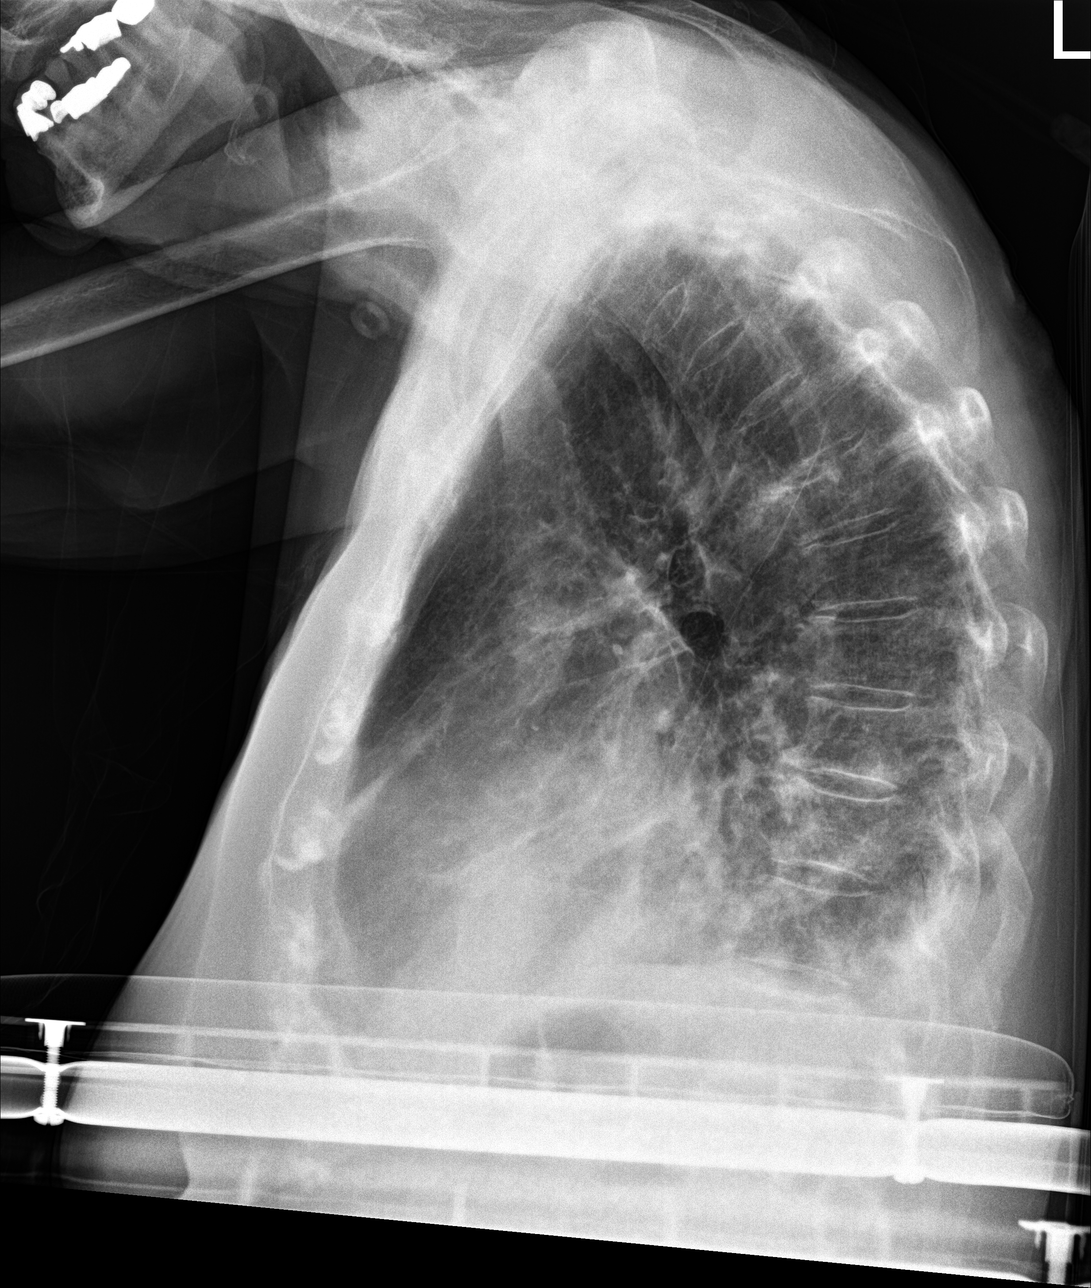

[chest ap (2 of 2)]
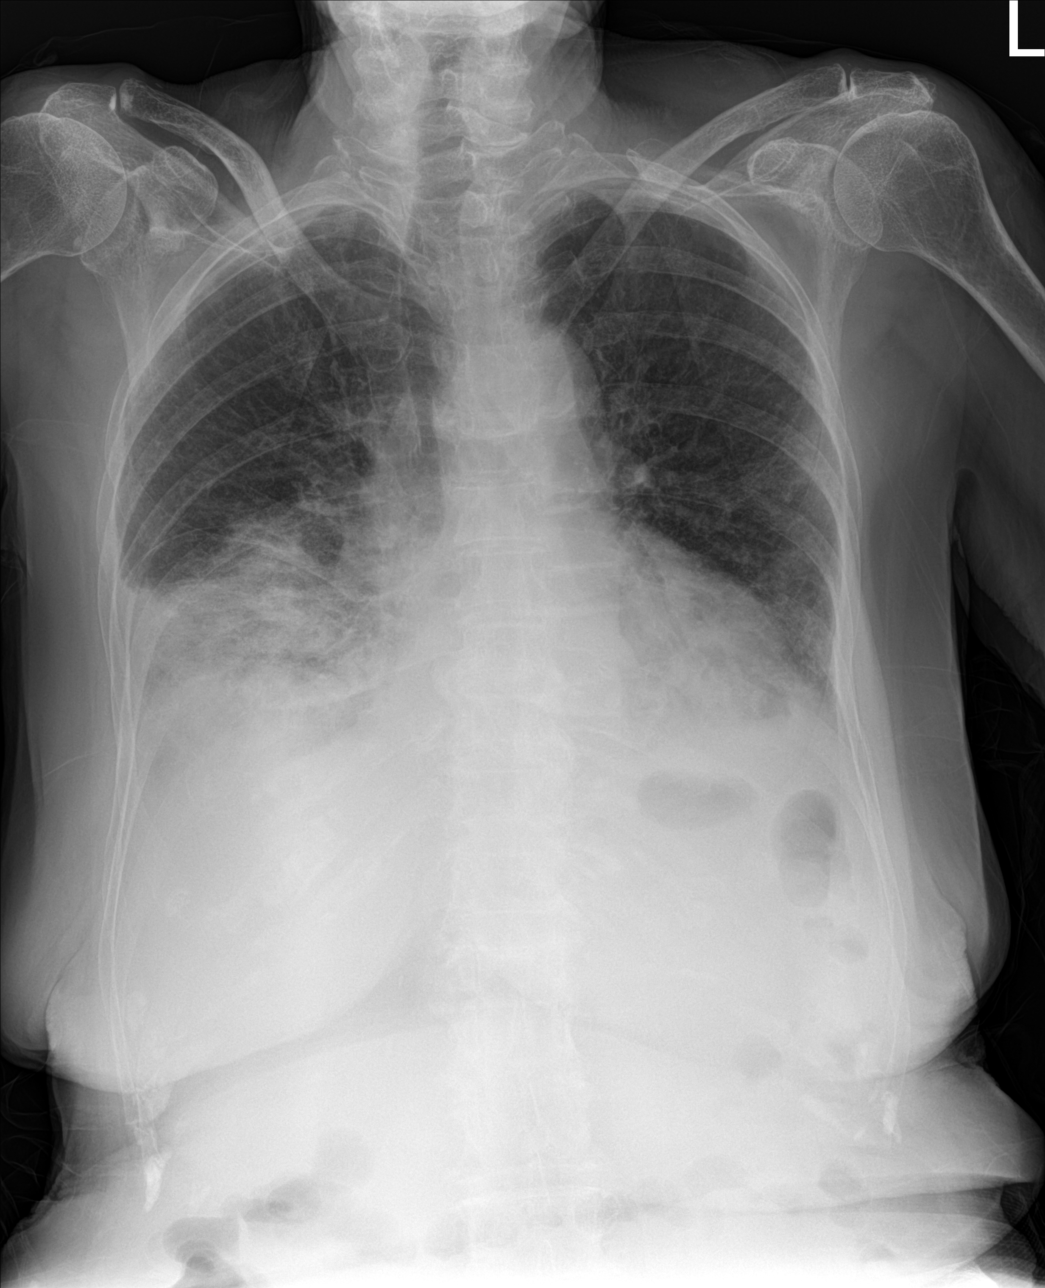

[3 of 3 positions shown; findings below may reference images not displayed]

FINDINGS: Indistinct and confluent bilateral lung base opacity, more extensive
on the right. Obscured right hemidiaphragm. Similar smaller area of
indistinct left mid lung opacity in the periphery (arrows). Mildly
lower lung volumes overall. Crowding of upper lobe lung markings.
Possible small pleural effusions. Cardiac size is at the upper
limits of normal. Other mediastinal contours are within normal
limits. Visualized tracheal air column is within normal limits. No
pneumothorax. No pulmonary edema. No acute osseous abnormality
identified. Negative visible bowel gas pattern.
IMPRESSION: 1. Bilateral airspace disease compatible with multifocal aspiration
and/or pneumonia, worst at the right lung base.
2. Possible associated small pleural effusions.

These results will be called to the ordering clinician or
representative by the Radiologist Assistant, and communication
documented in the PACS or zVision Dashboard.
